# Patient Record
Sex: Male | Born: 1940 | Race: White | Hispanic: No | Marital: Single | Smoking: Former smoker
Health system: Southern US, Community
[De-identification: ages and names within clinical notes are randomized; demographics above are authoritative.]

## PROBLEM LIST (undated history)

## (undated) DIAGNOSIS — R569 Unspecified convulsions: Secondary | ICD-10-CM

## (undated) DIAGNOSIS — I714 Abdominal aortic aneurysm, without rupture, unspecified: Secondary | ICD-10-CM

## (undated) DIAGNOSIS — Z941 Heart transplant status: Secondary | ICD-10-CM

## (undated) HISTORY — PX: KNEE SURGERY: SHX244

## (undated) HISTORY — PX: EYE SURGERY: SHX253

## (undated) HISTORY — PX: SHOULDER SURGERY: SHX246

## (undated) HISTORY — PX: CARDIAC SURGERY: SHX584

## (undated) HISTORY — PX: APPENDECTOMY: SHX54

---

## 2014-12-30 ENCOUNTER — Inpatient Hospital Stay (HOSPITAL_COMMUNITY)
Admission: EM | Admit: 2014-12-30 | Discharge: 2015-01-03 | DRG: 064 | Disposition: A | Discharge status: Skilled Nursing Facility | Payer: Medicare Other | Source: Other Acute Inpatient Hospital | Attending: Internal Medicine | Admitting: Internal Medicine

## 2014-12-30 ENCOUNTER — Emergency Department (HOSPITAL_COMMUNITY): Payer: Medicare Other

## 2014-12-30 ENCOUNTER — Encounter (HOSPITAL_COMMUNITY): Payer: Self-pay | Admitting: Radiology

## 2014-12-30 DIAGNOSIS — R401 Stupor: Secondary | ICD-10-CM

## 2014-12-30 DIAGNOSIS — G9340 Encephalopathy, unspecified: Secondary | ICD-10-CM | POA: Diagnosis present

## 2014-12-30 DIAGNOSIS — I639 Cerebral infarction, unspecified: Secondary | ICD-10-CM

## 2014-12-30 DIAGNOSIS — N183 Chronic kidney disease, stage 3 unspecified: Secondary | ICD-10-CM | POA: Diagnosis present

## 2014-12-30 DIAGNOSIS — I63511 Cerebral infarction due to unspecified occlusion or stenosis of right middle cerebral artery: Principal | ICD-10-CM | POA: Diagnosis present

## 2014-12-30 DIAGNOSIS — Z79899 Other long term (current) drug therapy: Secondary | ICD-10-CM

## 2014-12-30 DIAGNOSIS — Z8249 Family history of ischemic heart disease and other diseases of the circulatory system: Secondary | ICD-10-CM | POA: Diagnosis not present

## 2014-12-30 DIAGNOSIS — Z91048 Other nonmedicinal substance allergy status: Secondary | ICD-10-CM | POA: Diagnosis not present

## 2014-12-30 DIAGNOSIS — Z8546 Personal history of malignant neoplasm of prostate: Secondary | ICD-10-CM | POA: Diagnosis not present

## 2014-12-30 DIAGNOSIS — G904 Autonomic dysreflexia: Secondary | ICD-10-CM | POA: Diagnosis present

## 2014-12-30 DIAGNOSIS — I714 Abdominal aortic aneurysm, without rupture: Secondary | ICD-10-CM | POA: Diagnosis present

## 2014-12-30 DIAGNOSIS — R569 Unspecified convulsions: Secondary | ICD-10-CM | POA: Diagnosis not present

## 2014-12-30 DIAGNOSIS — E785 Hyperlipidemia, unspecified: Secondary | ICD-10-CM | POA: Diagnosis present

## 2014-12-30 DIAGNOSIS — I951 Orthostatic hypotension: Secondary | ICD-10-CM | POA: Diagnosis present

## 2014-12-30 DIAGNOSIS — Z941 Heart transplant status: Secondary | ICD-10-CM

## 2014-12-30 DIAGNOSIS — J189 Pneumonia, unspecified organism: Secondary | ICD-10-CM | POA: Diagnosis not present

## 2014-12-30 DIAGNOSIS — F0390 Unspecified dementia without behavioral disturbance: Secondary | ICD-10-CM | POA: Diagnosis present

## 2014-12-30 DIAGNOSIS — F05 Delirium due to known physiological condition: Secondary | ICD-10-CM | POA: Diagnosis not present

## 2014-12-30 DIAGNOSIS — G40909 Epilepsy, unspecified, not intractable, without status epilepticus: Secondary | ICD-10-CM | POA: Diagnosis present

## 2014-12-30 DIAGNOSIS — Z87891 Personal history of nicotine dependence: Secondary | ICD-10-CM | POA: Diagnosis not present

## 2014-12-30 DIAGNOSIS — Z993 Dependence on wheelchair: Secondary | ICD-10-CM | POA: Diagnosis not present

## 2014-12-30 DIAGNOSIS — I6789 Other cerebrovascular disease: Secondary | ICD-10-CM | POA: Diagnosis not present

## 2014-12-30 DIAGNOSIS — I6389 Other cerebral infarction: Secondary | ICD-10-CM

## 2014-12-30 HISTORY — DX: Abdominal aortic aneurysm, without rupture: I71.4

## 2014-12-30 HISTORY — DX: Unspecified convulsions: R56.9

## 2014-12-30 HISTORY — DX: Heart transplant status: Z94.1

## 2014-12-30 HISTORY — DX: Abdominal aortic aneurysm, without rupture, unspecified: I71.40

## 2014-12-30 LAB — COMPREHENSIVE METABOLIC PANEL
ALT: 48 U/L (ref 17–63)
ANION GAP: 7 (ref 5–15)
AST: 38 U/L (ref 15–41)
Albumin: 3.3 g/dL — ABNORMAL LOW (ref 3.5–5.0)
Alkaline Phosphatase: 138 U/L — ABNORMAL HIGH (ref 38–126)
BUN: 35 mg/dL — ABNORMAL HIGH (ref 6–20)
CHLORIDE: 102 mmol/L (ref 101–111)
CO2: 22 mmol/L (ref 22–32)
CREATININE: 1.58 mg/dL — AB (ref 0.61–1.24)
Calcium: 9 mg/dL (ref 8.9–10.3)
GFR calc non Af Amer: 41 mL/min — ABNORMAL LOW (ref >60)
GFR, EST AFRICAN AMERICAN: 47 mL/min — AB (ref >60)
Glucose, Bld: 176 mg/dL — ABNORMAL HIGH (ref 65–99)
Potassium: 4.8 mmol/L (ref 3.5–5.1)
SODIUM: 131 mmol/L — AB (ref 135–145)
Total Bilirubin: 0.4 mg/dL (ref 0.3–1.2)
Total Protein: 7.2 g/dL (ref 6.5–8.1)

## 2014-12-30 LAB — RAPID URINE DRUG SCREEN, HOSP PERFORMED
AMPHETAMINES: NOT DETECTED
BENZODIAZEPINES: NOT DETECTED
Barbiturates: NOT DETECTED
Cocaine: NOT DETECTED
Opiates: NOT DETECTED
Tetrahydrocannabinol: NOT DETECTED

## 2014-12-30 LAB — DIFFERENTIAL
BASOS ABS: 0 10*3/uL (ref 0.0–0.1)
BASOS PCT: 0 %
Eosinophils Absolute: 0 10*3/uL (ref 0.0–0.7)
Eosinophils Relative: 0 %
Lymphocytes Relative: 6 %
Lymphs Abs: 0.6 10*3/uL — ABNORMAL LOW (ref 0.7–4.0)
MONO ABS: 0.6 10*3/uL (ref 0.1–1.0)
Monocytes Relative: 6 %
NEUTROS ABS: 9 10*3/uL — AB (ref 1.7–7.7)
NEUTROS PCT: 88 %

## 2014-12-30 LAB — URINE MICROSCOPIC-ADD ON

## 2014-12-30 LAB — CBC
HCT: 32.1 % — ABNORMAL LOW (ref 39.0–52.0)
HCT: 33.2 % — ABNORMAL LOW (ref 39.0–52.0)
Hemoglobin: 11 g/dL — ABNORMAL LOW (ref 13.0–17.0)
Hemoglobin: 11.2 g/dL — ABNORMAL LOW (ref 13.0–17.0)
MCH: 29.4 pg (ref 26.0–34.0)
MCH: 29 pg (ref 26.0–34.0)
MCHC: 34.3 g/dL (ref 30.0–36.0)
MCHC: 33.7 g/dL (ref 30.0–36.0)
MCV: 85.8 fL (ref 78.0–100.0)
MCV: 86 fL (ref 78.0–100.0)
PLATELETS: 141 10*3/uL — AB (ref 150–400)
PLATELETS: 154 10*3/uL (ref 150–400)
RBC: 3.74 MIL/uL — ABNORMAL LOW (ref 4.22–5.81)
RBC: 3.86 MIL/uL — ABNORMAL LOW (ref 4.22–5.81)
RDW: 14.2 % (ref 11.5–15.5)
RDW: 14.2 % (ref 11.5–15.5)
WBC: 10.2 10*3/uL (ref 4.0–10.5)
WBC: 9.5 10*3/uL (ref 4.0–10.5)

## 2014-12-30 LAB — URINALYSIS, ROUTINE W REFLEX MICROSCOPIC
Bilirubin Urine: NEGATIVE
GLUCOSE, UA: 100 mg/dL — AB
Ketones, ur: NEGATIVE mg/dL
Leukocytes, UA: NEGATIVE
Nitrite: NEGATIVE
PH: 6 (ref 5.0–8.0)
Protein, ur: 100 mg/dL — AB
SPECIFIC GRAVITY, URINE: 1.025 (ref 1.005–1.030)
Urobilinogen, UA: 0.2 mg/dL (ref 0.0–1.0)

## 2014-12-30 LAB — I-STAT CHEM 8, ED
BUN: 36 mg/dL — ABNORMAL HIGH (ref 6–20)
Calcium, Ion: 1.21 mmol/L (ref 1.13–1.30)
Chloride: 102 mmol/L (ref 101–111)
Creatinine, Ser: 1.4 mg/dL — ABNORMAL HIGH (ref 0.61–1.24)
Glucose, Bld: 176 mg/dL — ABNORMAL HIGH (ref 65–99)
HEMATOCRIT: 34 % — AB (ref 39.0–52.0)
HEMOGLOBIN: 11.6 g/dL — AB (ref 13.0–17.0)
POTASSIUM: 4.7 mmol/L (ref 3.5–5.1)
SODIUM: 132 mmol/L — AB (ref 135–145)
TCO2: 19 mmol/L (ref 0–100)

## 2014-12-30 LAB — PROTIME-INR
INR: 1.25 (ref 0.00–1.49)
PROTHROMBIN TIME: 15.9 s — AB (ref 11.6–15.2)

## 2014-12-30 LAB — I-STAT TROPONIN, ED: Troponin i, poc: 0 ng/mL (ref 0.00–0.08)

## 2014-12-30 LAB — CREATININE, SERUM
Creatinine, Ser: 1.47 mg/dL — ABNORMAL HIGH (ref 0.61–1.24)
GFR calc Af Amer: 52 mL/min — ABNORMAL LOW (ref >60)
GFR calc non Af Amer: 45 mL/min — ABNORMAL LOW (ref >60)

## 2014-12-30 LAB — I-STAT CG4 LACTIC ACID, ED
Lactic Acid, Venous: 0.72 mmol/L (ref 0.5–2.0)
Lactic Acid, Venous: 1.27 mmol/L (ref 0.5–2.0)

## 2014-12-30 LAB — APTT: APTT: 29 s (ref 24–37)

## 2014-12-30 MED ORDER — GUAIFENESIN-DM 100-10 MG/5ML PO SYRP: 5.0000 mL | ORAL_SOLUTION | ORAL | Status: DC | PRN

## 2014-12-30 MED ORDER — ONDANSETRON HCL 4 MG/2ML IJ SOLN: 4.0000 mg | Freq: Four times a day (QID) | INTRAMUSCULAR | Status: DC | PRN

## 2014-12-30 MED ORDER — LORAZEPAM 2 MG/ML IJ SOLN: 1.0000 mg | Freq: Four times a day (QID) | INTRAMUSCULAR | Status: DC | PRN

## 2014-12-30 MED ORDER — PIPERACILLIN-TAZOBACTAM 3.375 G IVPB: 3.3750 g | Freq: Three times a day (TID) | INTRAVENOUS | Status: DC

## 2014-12-30 MED ORDER — TACROLIMUS 1 MG PO CAPS
4.0000 mg | ORAL_CAPSULE | Freq: Every day | ORAL | Status: DC
  Administered 2014-12-31 – 2015-01-02 (×3): 4 mg via ORAL
  Filled 2014-12-30 (×5): qty 4

## 2014-12-30 MED ORDER — ALBUTEROL SULFATE (2.5 MG/3ML) 0.083% IN NEBU: 2.5000 mg | INHALATION_SOLUTION | RESPIRATORY_TRACT | Status: DC | PRN

## 2014-12-30 MED ORDER — ACETAMINOPHEN 650 MG RE SUPP: 650.0000 mg | Freq: Four times a day (QID) | RECTAL | Status: DC | PRN

## 2014-12-30 MED ORDER — SODIUM CHLORIDE 1 G PO TABS
1.0000 g | ORAL_TABLET | Freq: Four times a day (QID) | ORAL | Status: DC
  Administered 2014-12-31 – 2015-01-03 (×13): 1 g via ORAL
  Filled 2014-12-30 (×17): qty 1

## 2014-12-30 MED ORDER — SULFAMETHOXAZOLE-TRIMETHOPRIM 400-80 MG PO TABS
1.0000 | ORAL_TABLET | ORAL | Status: DC
  Administered 2014-12-31 – 2015-01-03 (×2): 1 via ORAL
  Filled 2014-12-30 (×2): qty 1

## 2014-12-30 MED ORDER — FINASTERIDE 5 MG PO TABS
5.0000 mg | ORAL_TABLET | Freq: Every day | ORAL | Status: DC
  Administered 2014-12-31 – 2015-01-03 (×4): 5 mg via ORAL
  Filled 2014-12-30 (×4): qty 1

## 2014-12-30 MED ORDER — PIPERACILLIN-TAZOBACTAM 3.375 G IVPB 30 MIN: 3.3750 g | Freq: Once | INTRAVENOUS | Status: DC

## 2014-12-30 MED ORDER — ONDANSETRON HCL 4 MG PO TABS: 4.0000 mg | ORAL_TABLET | Freq: Four times a day (QID) | ORAL | Status: DC | PRN

## 2014-12-30 MED ORDER — PROSIGHT PO TABS
1.0000 | ORAL_TABLET | Freq: Every day | ORAL | Status: DC
Start: 2014-12-31 — End: 2015-01-03
  Administered 2014-12-31 – 2015-01-03 (×4): 1 via ORAL
  Filled 2014-12-30 (×4): qty 1

## 2014-12-30 MED ORDER — VANCOMYCIN HCL IN DEXTROSE 750-5 MG/150ML-% IV SOLN
750.0000 mg | Freq: Two times a day (BID) | INTRAVENOUS | Status: DC
  Administered 2014-12-31 – 2015-01-01 (×3): 750 mg via INTRAVENOUS
  Filled 2014-12-30 (×5): qty 150

## 2014-12-30 MED ORDER — TACROLIMUS 1 MG PO CAPS
3.0000 mg | ORAL_CAPSULE | Freq: Every day | ORAL | Status: DC
  Administered 2014-12-31 – 2015-01-03 (×4): 3 mg via ORAL
  Filled 2014-12-30 (×4): qty 3

## 2014-12-30 MED ORDER — SODIUM CHLORIDE 0.9 % IV SOLN
INTRAVENOUS | Status: DC
Start: 2014-12-30 — End: 2015-01-02
  Administered 2014-12-30 – 2015-01-01 (×4): via INTRAVENOUS

## 2014-12-30 MED ORDER — VANCOMYCIN HCL 10 G IV SOLR
1750.0000 mg | Freq: Once | INTRAVENOUS | Status: AC
  Administered 2014-12-30: 1750 mg via INTRAVENOUS
  Filled 2014-12-30: qty 1750

## 2014-12-30 MED ORDER — ENOXAPARIN SODIUM 40 MG/0.4ML ~~LOC~~ SOLN
40.0000 mg | SUBCUTANEOUS | Status: DC
Start: 2014-12-31 — End: 2015-01-03
  Administered 2014-12-31 – 2015-01-03 (×4): 40 mg via SUBCUTANEOUS
  Filled 2014-12-30 (×4): qty 0.4

## 2014-12-30 MED ORDER — MIDODRINE HCL 5 MG PO TABS
10.0000 mg | ORAL_TABLET | Freq: Three times a day (TID) | ORAL | Status: DC
  Administered 2014-12-31 – 2015-01-03 (×10): 10 mg via ORAL
  Filled 2014-12-30 (×10): qty 2

## 2014-12-30 MED ORDER — ACETAMINOPHEN 325 MG PO TABS: 650.0000 mg | ORAL_TABLET | Freq: Four times a day (QID) | ORAL | Status: DC | PRN

## 2014-12-30 MED ORDER — PRAVASTATIN SODIUM 40 MG PO TABS
40.0000 mg | ORAL_TABLET | Freq: Every day | ORAL | Status: DC
  Administered 2014-12-31: 40 mg via ORAL
  Filled 2014-12-30: qty 1

## 2014-12-30 MED ORDER — FAMOTIDINE 20 MG PO TABS
20.0000 mg | ORAL_TABLET | Freq: Every day | ORAL | Status: DC
  Administered 2014-12-31 – 2015-01-03 (×4): 20 mg via ORAL
  Filled 2014-12-30 (×4): qty 1

## 2014-12-30 MED ORDER — SODIUM CHLORIDE 0.9 % IV SOLN
500.0000 mg | Freq: Three times a day (TID) | INTRAVENOUS | Status: DC
  Administered 2014-12-30 – 2014-12-31 (×3): 500 mg via INTRAVENOUS
  Filled 2014-12-30 (×5): qty 500

## 2014-12-30 MED ORDER — LEVETIRACETAM 500 MG/5ML IV SOLN
500.0000 mg | Freq: Two times a day (BID) | INTRAVENOUS | Status: DC
  Administered 2014-12-30 – 2014-12-31 (×2): 500 mg via INTRAVENOUS
  Filled 2014-12-30 (×3): qty 5

## 2014-12-30 MED ORDER — SODIUM CHLORIDE 0.9 % IJ SOLN
3.0000 mL | Freq: Two times a day (BID) | INTRAMUSCULAR | Status: DC
  Administered 2014-12-30 – 2015-01-03 (×8): 3 mL via INTRAVENOUS

## 2014-12-30 MED ORDER — IOHEXOL 350 MG/ML SOLN
50.0000 mL | Freq: Once | INTRAVENOUS | Status: AC | PRN
  Administered 2014-12-30: 50 mL via INTRAVENOUS

## 2014-12-30 MED ORDER — SERTRALINE HCL 50 MG PO TABS
50.0000 mg | ORAL_TABLET | Freq: Every day | ORAL | Status: DC
  Administered 2014-12-31 – 2015-01-03 (×4): 50 mg via ORAL
  Filled 2014-12-30 (×4): qty 1

## 2014-12-30 NOTE — ED Notes (Signed)
Pt's daughter states that the pt has orthostatic hypotension, and actually takes medication to prevent his BP from dropping when he changes position.

## 2014-12-30 NOTE — ED Notes (Signed)
Westly PamLacy with [facility] called to check on pt. Made her aware that pt would be admitted and will not be at home for care today.

## 2014-12-30 NOTE — ED Notes (Signed)
Attempted report 

## 2014-12-30 NOTE — Progress Notes (Addendum)
Dr.Adrienne Mocha.  Alexander Conley  Has been aware of Pt BP 166/104. At this point rec'd orders to not controlling BP unless greater than 180. Will cont. To monitor BP  Pt is confused unable to get initial admission assessment done.No family member at bedside.

## 2014-12-30 NOTE — Procedures (Signed)
ELECTROENCEPHALOGRAM REPORT   Patient: Alexander Conley       Age: 74 y.o.        Sex: male Referring Physician: Dr Thad Rangereynolds Report Date:  12/30/2014        Interpreting Physician: Omelia BlackwaterSUMNER, Lauria Depoy JUSTIN  History: Alexander OdeaGerald Hakanson is an 74 y.o. male admitted with alterede mental status  Medications:  Scheduled:  Conditions of Recording:  This is a 16 channel EEG carried out with the patient in the drowsy state.  Description:  The background activity consists of a low voltage, symmetrical, poorly organized mixed of theta and occasional delta activity. There is also brief runs of posterior beta activity. No posterior dominant alpha rhythm is noted. No focal slowing or epileptiform activity is noted  Hyperventilation and intermittent photic stimulation was not performed.   IMPRESSION: Abnormal EEG due to the presence of generalized irregular slowing indicating a mild to moderate cerebral disturbance (encephalopathy). No focal slowing or epileptiform activity noted.    Elspeth Choeter Shaunte Tuft, DO [facility] [phone]  If 7pm- 7am, please page neurology on call as listed in AMION. 12/30/2014, 4:46 PM

## 2014-12-30 NOTE — ED Provider Notes (Signed)
CSN: [id number]     Arrival date & time 12/30/14  1530 History   First MD Initiated Contact with Patient 12/30/14 1542     Chief Complaint  Patient presents with  . Code Stroke     (Consider location/radiation/quality/duration/timing/severity/associated sxs/prior Treatment) The history is provided by the spouse and the EMS personnel. The history is limited by the condition of the patient.  Alexander Conley is a 74 y.o. male hx of aortic aneurysm, heart transplant, multiple myeloma, CHF here with stroke symptoms. Patient woke up around 8:00 this morning. He was doing fine until 9:15 AM. Wife noticed that he seemed to fall asleep. She tries to wake him up but he did not wake up and didn't answer any questions. She called EMS and went [facility]. He had a stroke workup and transferred here to neurology eval. Labs there is significant for white count 12, lactate 8, creatinine 1.8. Patient cannot get MRI since he has aortic stent and metal around his heart. Has a history TIAs in past. Patient actually had 3 admissions over the last month for orthostasis and syncope.     Past Medical History  Diagnosis Date  . H/O heart transplant ([place])   . Abdominal aortic aneurysm (AAA) ([place])   . Seizures [facility])    Past Surgical History  Procedure Laterality Date  . Cardiac surgery      Heart Transplant  . Appendectomy    . Shoulder surgery    . Knee surgery    . Eye surgery      Cataract   Family History  Problem Relation Age of Onset  . Hypertension Mother   . Hyperlipidemia Mother    Social History  Substance Use Topics  . Smoking status: Former Research scientist (life sciences)  . Smokeless tobacco: None  . Alcohol Use: Yes     Comment: Rarely    Review of Systems  Unable to perform ROS: Acuity of condition  Neurological: Positive for speech difficulty and weakness.  All other systems reviewed and are negative.     Allergies  Tape  Home Medications   Prior to Admission medications   Medication Sig  Start Date End Date Taking? Authorizing Provider  amoxicillin (AMOXIL) 500 MG capsule Take 2,000 mg by mouth as needed (1 hour prior to dental appt).  10/13/14  Yes Historical Provider, MD  docusate sodium (COLACE) 100 MG capsule Take 100 mg by mouth at bedtime.   Yes Historical Provider, MD  famotidine (PEPCID) 20 MG tablet Take 20 mg by mouth daily at 12 noon.   Yes Historical Provider, MD  finasteride (PROSCAR) 5 MG tablet Take 5 mg by mouth daily at 12 noon. 12/15/14 12/15/15 Yes Historical Provider, MD  midodrine (PROAMATINE) 10 MG tablet Take 10 mg by mouth 3 (three) times daily. 12/15/14  Yes Historical Provider, MD  Multiple Vitamins-Minerals (PRESERVISION AREDS 2) CAPS Take 1 tablet by mouth daily at 12 noon.   Yes Historical Provider, MD  pravastatin (PRAVACHOL) 40 MG tablet Take 40 mg by mouth at bedtime.   Yes Historical Provider, MD  sertraline (ZOLOFT) 50 MG tablet Take 50 mg by mouth daily at 12 noon.   Yes Historical Provider, MD  sodium chloride 1 G tablet Take 1 g by mouth 4 (four) times daily.   Yes Historical Provider, MD  sulfamethoxazole-trimethoprim (BACTRIM,SEPTRA) 400-80 MG tablet Take 1 tablet by mouth every Monday, Wednesday, and Friday.   Yes Historical Provider, MD  tacrolimus (PROGRAF) 1 MG capsule Take 3-4 mg by  mouth 2 (two) times daily. 11/10/14 11/10/15 Yes Historical Provider, MD   BP 174/112 mmHg  Pulse 95  Temp(Src) 97.1 F (36.2 C) (Oral)  Resp 20  Ht 6' 1"  (1.854 m)  Wt 188 lb (85.276 kg)  BMI 24.81 kg/m2  SpO2 97% Physical Exam  Constitutional:  Altered,   HENT:  Head: Normocephalic.  Eyes:  R gaze preference   Neck: Normal range of motion.  Cardiovascular: Normal rate, regular rhythm and normal heart sounds.   Pulmonary/Chest: Effort normal and breath sounds normal. No respiratory distress. He has no wheezes. He has no rales.  Abdominal: Soft. Bowel sounds are normal. He exhibits no distension. There is no tenderness. There is no rebound.   Musculoskeletal: Normal range of motion.  Neurological:  Altered. Responds to pain on L side more than Right. Moving l side more than right   Skin: Skin is warm and dry.  Psychiatric:  Unable   Nursing note and vitals reviewed.   ED Course  Procedures (including critical care time) Labs Review Labs Reviewed  CBC - Abnormal; Notable for the following:    RBC 3.74 (*)    Hemoglobin 11.0 (*)    HCT 32.1 (*)    Platelets 141 (*)    All other components within normal limits  DIFFERENTIAL - Abnormal; Notable for the following:    Neutro Abs 9.0 (*)    Lymphs Abs 0.6 (*)    All other components within normal limits  COMPREHENSIVE METABOLIC PANEL - Abnormal; Notable for the following:    Sodium 131 (*)    Glucose, Bld 176 (*)    BUN 35 (*)    Creatinine, Ser 1.58 (*)    Albumin 3.3 (*)    Alkaline Phosphatase 138 (*)    GFR calc non Af Amer 41 (*)    GFR calc Af Amer 47 (*)    All other components within normal limits  I-STAT CHEM 8, ED - Abnormal; Notable for the following:    Sodium 132 (*)    BUN 36 (*)    Creatinine, Ser 1.40 (*)    Glucose, Bld 176 (*)    Hemoglobin 11.6 (*)    HCT 34.0 (*)    All other components within normal limits  CULTURE, BLOOD (ROUTINE X 2)  CULTURE, BLOOD (ROUTINE X 2)  ETHANOL  PROTIME-INR  APTT  URINE RAPID DRUG SCREEN, HOSP PERFORMED  URINALYSIS, ROUTINE W REFLEX MICROSCOPIC (NOT AT [facility])  I-STAT TROPOININ, ED  I-STAT CG4 LACTIC ACID, ED    Imaging Review Ct Angio Head W/cm &/or Wo Cm  12/30/2014  CLINICAL DATA:  74 year old male with left-sided weakness (per Dr. Janann Colonel). Initial encounter. EXAM: CT ANGIOGRAPHY HEAD AND NECK TECHNIQUE: Multidetector CT imaging of the head and neck was performed using the standard protocol during bolus administration of intravenous contrast. Multiplanar CT image reconstructions and MIPs were obtained to evaluate the vascular anatomy. Carotid stenosis measurements (when applicable) are obtained utilizing  NASCET criteria, using the distal internal carotid diameter as the denominator. CONTRAST:  55m OMNIPAQUE IOHEXOL 350 MG/ML SOLN COMPARISON:  None. FINDINGS: CT HEAD Brain: Possibility of right middle cerebral artery distribution is raised. No intracranial hemorrhage or new intracranial enhancing lesion. No hydrocephalus. Calvarium and skull base: Negative. Paranasal sinuses: Clear. Orbits: Negative.  Post lens replacement. CTA NECK Aortic arch: Ectatic 3 vessel aortic arch. Calcified plaque with slight narrowing origin left common carotid artery and left subclavian artery. Right carotid system: Plaque right carotid bifurcation. Ectatic right  internal carotid artery with irregular plaque contributing to 68% diameter stenosis proximal right internal carotid artery. Plaque distal cervical segment right internal carotid artery with less than 50% diameter stenosis. Left carotid system: Plaque left carotid bifurcation. 64% diameter stenosis proximal left internal carotid artery. Calcification distal left internal carotid artery cervical segment with less than 50% diameter stenosis. Vertebral arteries:Minimal narrowing origin of the right vertebral artery. Mild narrowing origin left vertebral artery. Right vertebral artery slightly dominant in size. Skeleton: Multilevel cervical spondylotic changes with various degrees of spinal stenosis and foraminal narrowing. Other neck: No worrisome primary neck mass or lung apical lesion. CTA HEAD Anterior circulation: Atherosclerotic type changes cavernous segment of the internal carotid artery bilaterally. Mild narrowing right internal carotid artery cavernous segment with associated ectasia. Slightly prominent infundibulum without aneurysm. Mild to slightly moderate narrowing portion of the left internal carotid artery proximal cavernous segment of poststenotic ectasia. Questionable decrease number of visualized right middle cerebral artery branch vessels when compared to left.  Middle cerebral artery branch vessel narrowing and irregularity bilaterally. Posterior circulation: Ectatic vertebral arteries without high-grade stenosis. Ectatic basilar artery with possible small fenestration without high-grade stenosis. Venous sinuses: Patent. Anatomic variants: Negative. Delayed phase: Not obtained as per Dr. Hazle Quant request. Exam reviewed with Dr. Ronnette Juniper at the time of imaging. IMPRESSION: CT HEAD Possibility of right middle cerebral artery distribution is raised. No intracranial hemorrhage or new intracranial enhancing lesion. CTA NECK Ectatic right internal carotid artery with irregular plaque contributing to 68% diameter stenosis proximal right internal carotid artery. Plaque distal cervical segment right internal carotid artery with less than 50% diameter stenosis. 64% diameter stenosis proximal left internal carotid artery. Calcification distal left internal carotid artery cervical segment with less than 50% diameter stenosis. Minimal narrowing origin of the right vertebral artery. Mild narrowing origin left vertebral artery. Right vertebral artery slightly dominant in size. CTA HEAD Atherosclerotic type changes cavernous segment of the internal carotid artery bilaterally. Mild narrowing right internal carotid artery cavernous segment with associated ectasia. Mild to slightly moderate narrowing portion of the left internal carotid artery proximal cavernous segment of poststenotic ectasia. Questionable decrease number of visualized right middle cerebral artery branch vessels when compared to left. Middle cerebral artery branch vessel narrowing and irregularity bilaterally. Ectatic vertebral arteries without high-grade stenosis. Ectatic basilar artery with possible small fenestration without high-grade stenosis. Electronically Signed   By: Genia Del M.D.   On: 12/30/2014 16:34   Ct Angio Neck W/cm &/or Wo/cm  12/30/2014  CLINICAL DATA:  74 year old male with left-sided weakness  (per Dr. Janann Colonel). Initial encounter. EXAM: CT ANGIOGRAPHY HEAD AND NECK TECHNIQUE: Multidetector CT imaging of the head and neck was performed using the standard protocol during bolus administration of intravenous contrast. Multiplanar CT image reconstructions and MIPs were obtained to evaluate the vascular anatomy. Carotid stenosis measurements (when applicable) are obtained utilizing NASCET criteria, using the distal internal carotid diameter as the denominator. CONTRAST:  59m OMNIPAQUE IOHEXOL 350 MG/ML SOLN COMPARISON:  None. FINDINGS: CT HEAD Brain: Possibility of right middle cerebral artery distribution is raised. No intracranial hemorrhage or new intracranial enhancing lesion. No hydrocephalus. Calvarium and skull base: Negative. Paranasal sinuses: Clear. Orbits: Negative.  Post lens replacement. CTA NECK Aortic arch: Ectatic 3 vessel aortic arch. Calcified plaque with slight narrowing origin left common carotid artery and left subclavian artery. Right carotid system: Plaque right carotid bifurcation. Ectatic right internal carotid artery with irregular plaque contributing to 68% diameter stenosis proximal right internal carotid artery. Plaque distal cervical segment  right internal carotid artery with less than 50% diameter stenosis. Left carotid system: Plaque left carotid bifurcation. 64% diameter stenosis proximal left internal carotid artery. Calcification distal left internal carotid artery cervical segment with less than 50% diameter stenosis. Vertebral arteries:Minimal narrowing origin of the right vertebral artery. Mild narrowing origin left vertebral artery. Right vertebral artery slightly dominant in size. Skeleton: Multilevel cervical spondylotic changes with various degrees of spinal stenosis and foraminal narrowing. Other neck: No worrisome primary neck mass or lung apical lesion. CTA HEAD Anterior circulation: Atherosclerotic type changes cavernous segment of the internal carotid artery  bilaterally. Mild narrowing right internal carotid artery cavernous segment with associated ectasia. Slightly prominent infundibulum without aneurysm. Mild to slightly moderate narrowing portion of the left internal carotid artery proximal cavernous segment of poststenotic ectasia. Questionable decrease number of visualized right middle cerebral artery branch vessels when compared to left. Middle cerebral artery branch vessel narrowing and irregularity bilaterally. Posterior circulation: Ectatic vertebral arteries without high-grade stenosis. Ectatic basilar artery with possible small fenestration without high-grade stenosis. Venous sinuses: Patent. Anatomic variants: Negative. Delayed phase: Not obtained as per Dr. Hazle Quant request. Exam reviewed with Dr. Ronnette Juniper at the time of imaging. IMPRESSION: CT HEAD Possibility of right middle cerebral artery distribution is raised. No intracranial hemorrhage or new intracranial enhancing lesion. CTA NECK Ectatic right internal carotid artery with irregular plaque contributing to 68% diameter stenosis proximal right internal carotid artery. Plaque distal cervical segment right internal carotid artery with less than 50% diameter stenosis. 64% diameter stenosis proximal left internal carotid artery. Calcification distal left internal carotid artery cervical segment with less than 50% diameter stenosis. Minimal narrowing origin of the right vertebral artery. Mild narrowing origin left vertebral artery. Right vertebral artery slightly dominant in size. CTA HEAD Atherosclerotic type changes cavernous segment of the internal carotid artery bilaterally. Mild narrowing right internal carotid artery cavernous segment with associated ectasia. Mild to slightly moderate narrowing portion of the left internal carotid artery proximal cavernous segment of poststenotic ectasia. Questionable decrease number of visualized right middle cerebral artery branch vessels when compared to left.  Middle cerebral artery branch vessel narrowing and irregularity bilaterally. Ectatic vertebral arteries without high-grade stenosis. Ectatic basilar artery with possible small fenestration without high-grade stenosis. Electronically Signed   By: Genia Del M.D.   On: 12/30/2014 16:34   Dg Chest Port 1 View  12/30/2014  CLINICAL DATA:  Altered mental status and right-sided weakness EXAM: PORTABLE CHEST 1 VIEW COMPARISON:  12/30/2014 from [facility] FINDINGS: Streaky retrocardiac opacity, favoring atelectasis given morphology. Borderline cardiomegaly. Negative aortic and hilar contours. Patient has undergone median sternotomy, reportedly for heart transplant. No edema, effusion, or air leak. Symmetric biapical pleural scarring. IMPRESSION: Mild retrocardiac opacification, likely atelectasis. Given history of altered mental status, underlying aspiration should be considered. Electronically Signed   By: Monte Fantasia M.D.   On: 12/30/2014 16:12   I have personally reviewed and evaluated these images and lab results as part of my medical decision-making.   EKG Interpretation None      MDM   Final diagnoses:  Stroke [employer] [facility])   Alexander Conley is a 75 y.o. male here with stroke. Outside of window for TPA right now. Neuro at bedside and wants CT angio and wants to evaluate to see if he is a candidate for intra arterial TPA.   4:54 PM Lactate nl now. CXR showed possible atelectasis vs aspiration. Given multiple recent hospitalizations, will admit and give HCAP coverage. Neuro doesn't want intra arterial TPA.  Wandra Arthurs, MD 12/30/14 [phone]

## 2014-12-30 NOTE — ED Notes (Addendum)
This RN spoke with Mercy, RN on 2C, who states that she would like rapid response to assess the pt before he comes upstairs. April, RN with rapid response to see pt.

## 2014-12-30 NOTE — ED Notes (Signed)
Admitting MD at bedside.

## 2014-12-30 NOTE — ED Notes (Signed)
[facility]- pt coming from [facility] with stroke symptoms. Pt last seen normal at 0915 by family member. Pt has no hx of stroke. Right side gaze, right side weakness, pt has been minimally responsive at previous hospital as well as with EMS. Pt responsive to painful stimuli.

## 2014-12-30 NOTE — Code Documentation (Signed)
74[facility] at [phone].  Patient [facility].  Patient was LKW at 0915 at home when his wife went to take a shower and returned to find him unresponsive.  [facility] where he subsequently had a seizure and received Ativan.  Patient with reported right gaze, right sided weakness and aphasia.  Patient with h/o HTN and heart transplant in 2011.  Stroke team at the bedside on arrival.  Patient to CT for CTA head and neck on arrival.  NIHSS 35, see documentation for details and code stroke times.  Patient is outside the window for tPA and is not an endovascular candidate.  Bedside handoff with ED RN Nehemiah SettleBrooke.

## 2014-12-30 NOTE — ED Notes (Signed)
Pt has become more alert, mumbling sentences that do not make sense. Pt still does not follow commands, but will look at the speaker.

## 2014-12-30 NOTE — Progress Notes (Signed)
STAT EEG completed; results pending. 

## 2014-12-30 NOTE — ED Notes (Signed)
April, RN pt is okay to [facility], but would like to see his BP addressed before going upstairs.

## 2014-12-30 NOTE — Progress Notes (Addendum)
ANTIBIOTIC CONSULT NOTE - INITIAL  Pharmacy Consult for Zosyn and Vancomycin Indication: HCAP  Allergies  Allergen Reactions  . Tape Hives    adhesive    Patient Measurements: Height: 6\' 1"  (185.4 cm) Weight: 188 lb (85.276 kg) IBW/kg (Calculated) : 79.9  Vital Signs: Temp: 97.1 F (36.2 C) (10/13 1550) Temp Source: Oral (10/13 1550) BP: 174/112 mmHg (10/13 1645) Pulse Rate: 95 (10/13 1645) Intake/Output from previous day:   Intake/Output from this shift:    Labs:  Recent Labs  12/30/14 1600 12/30/14 1611  WBC 10.2  --   HGB 11.0* 11.6*  PLT 141*  --   CREATININE 1.58* 1.40*   Estimated Creatinine Clearance: 50.7 mL/min (by C-G formula based on Cr of 1.4). No results for input(s): VANCOTROUGH, VANCOPEAK, VANCORANDOM, GENTTROUGH, GENTPEAK, GENTRANDOM, TOBRATROUGH, TOBRAPEAK, TOBRARND, AMIKACINPEAK, AMIKACINTROU, AMIKACIN in the last 72 hours.   Microbiology: No results found for this or any previous visit (from the past 720 hour(s)).  Medical History: Past Medical History  Diagnosis Date  . H/O heart transplant ([facility])   . Abdominal aortic aneurysm (AAA) ([facility])   . Seizures [facility](H[facility])     Assessment: 74 yo [facility]. CXR showed Mild opacification that's likely atelectasis, but given history of AMS aspiration should be considered. Pharmacy consulted to dose abx for HCAP. Afebrile, WBC wnl. SCr elevated at 1.4. CrCl ~3850ml/min.  Goal of Therapy:  Vancomycin trough level 15-20 mcg/ml  Resolution of infection  Plan:  Give Zosyn 3.375g IV (30 min infusion) x 1, then start Zosyn 3.375 gm IV q8h (4 hour infusion) Give vancomycin 1,750mg  IV x 1, then start vancomycin 750mg  IV Q12 Monitor clinical picture, renal function, VT prn F/U C&S, abx deescalation / LOT  Enzo BiBATCHELDER,Latanza Pfefferkorn J 12/30/2014,4:48 PM  ADDENDUM:  Pharmacy consulted to start imipenem instead of Zosyn.CrCl ~5750ml/min.   Plan: Start imipenem 500mg  IV  Q8 Monitor clinical picture, renal function F/U C&S, abx deescalation / LOT

## 2014-12-30 NOTE — Consult Note (Signed)
Referring Physician: Silverio LayYao    Chief Complaint: AMS/seizure/code stroke  HPI:                                                                                                                                         Alexander Conley is an 74 y.o. male who awoke this AM at 0830 and per wife was his baseline. Baseline is :wheel chair bound, 2 person assist to transfer, needs assistance bathing, can feed himself, spends most days in bed or in chair. At 0915 he was noted by his wife to have AMS not talking or following commands.  He was [facility] where he was noted to have a TC seizure and given 2 mg Ativan. On arrival patient is obtunded, follows no commands, able to breath on his own and withdraws from pain on the left not the right. CTA of head was obtained which showed a possible distal right MCA occlusion which would not explain his symptoms.  EEG stat was called.   He cannot have MRI due to metal in his body  Date last known well: Date: 12/30/2014 Time last known well: Time: 09:15 tPA Given: No: out of window     Past Medical History  Diagnosis Date  . H/O heart transplant (HCC)   . Abdominal aortic aneurysm (AAA) (HCC)   . Seizures [facility](HCC)     Past Surgical History  Procedure Laterality Date  . Cardiac surgery      Heart Transplant  . Appendectomy    . Shoulder surgery    . Knee surgery    . Eye surgery      Cataract    Family History  Problem Relation Age of Onset  . Hypertension Mother   . Hyperlipidemia Mother    Social History:  reports that he has quit smoking. He does not have any smokeless tobacco history on file. He reports that he drinks alcohol. His drug history is not on file.  Allergies: No Known Allergies  Medications:                                                                                                                           No current facility-administered medications for this encounter.   No current outpatient prescriptions on  file.     ROS:  History obtained from unobtainable from patient due to mental status   Neurologic Examination:                                                                                                      Blood pressure 187/110, pulse 92, resp. rate 24, height  (1.854 m), weight 85.276 kg (188 lb), SpO2 98 %.  HEENT-  Normocephalic, no lesions, without obvious abnormality.  Normal external eye and conjunctiva.  Normal TM's bilaterally.  Normal auditory canals and external ears. Normal external nose, mucus membranes and septum.  Normal pharynx. Cardiovascular- S1, S2 normal, pulses palpable throughout   Lungs- chest clear, no wheezing, rales, normal symmetric air entry Abdomen- normal findings: bowel sounds normal Extremities- no edema Lymph-no adenopathy palpable Musculoskeletal-no joint tenderness, deformity or swelling Skin-warm and dry, no hyperpigmentation, vitiligo, or suspicious lesions  Neurological Examination Mental Status: Patient does not respond to verbal stimuli.  Does not respond to deep sternal rub.  Does not follow commands.  No verbalizations noted.  Cranial Nerves: II: patient does not respond confrontation bilaterally, pupils right 2 mm, left 1 mm,and minimally reactive bilaterally III,IV,VI: doll's response present bilaterally.  V,VII: corneal reflex present bilaterally  VIII: patient does not respond to verbal stimuli IX,X: gag reflex present, XI: trapezius strength unable to test bilaterally XII: tongue strength unable to test Motor: Extremities flaccid on the right.  withdraws from pain minimally on the right Sensory: As above. Deep Tendon Reflexes:  2 plus throughout. Plantars: downgoing bilaterally Cerebellar: Unable to perform       Lab Results: Basic Metabolic Panel: No results for input(s): NA, K,  CL, CO2, GLUCOSE, BUN, CREATININE, CALCIUM, MG, PHOS in the last 168 hours.  Liver Function Tests: No results for input(s): AST, ALT, ALKPHOS, BILITOT, PROT, ALBUMIN in the last 168 hours. No results for input(s): LIPASE, AMYLASE in the last 168 hours. No results for input(s): AMMONIA in the last 168 hours.  CBC: No results for input(s): WBC, NEUTROABS, HGB, HCT, MCV, PLT in the last 168 hours.  Cardiac Enzymes: No results for input(s): CKTOTAL, CKMB, CKMBINDEX, TROPONINI in the last 168 hours.  Lipid Panel: No results for input(s): CHOL, TRIG, HDL, CHOLHDL, VLDL, LDLCALC in the last 168 hours.  CBG: No results for input(s): GLUCAP in the last 168 hours.  Microbiology: No results found for this or any previous visit.  Coagulation Studies: No results for input(s): LABPROT, INR in the last 72 hours.  Imaging: No results found.     Assessment and plan discussed with with attending physician and they are in agreement.    Felicie Morn PA-C [facility] [phone]  12/30/2014, 3:58 PM   Assessment: 74 y.o. male with history of baseline dementia presenting with AMS of unknown etiology.  At 0915 he was noted by his wife to be minimally unresponsive, not talking with question of right sided weakness. While in [place] ED he was noted to have a tonic clonic seizure broke with  of ativan. CTA at [facility] shows possible right distal branch MCA occlusion which would not fully explain  his symptoms. No large vessel occlusion amendable to IR intervention. Stat EEG shows no acute seizure activity. Unclear etiology of his presentation.  Differential includes CVA vs seizure with post ictal state, metabolic dysfunction, embolic shower vs possible PRES/hypertensive encephalopathy. Currently afebrile with no leukocytosis making infectious process less likely. Saturating well making PE less likely but this would also be in the differential.   -EEG read pending -repeat head CT in the  morning (unable to get MRI as wife reports he has metal in his abdomen) -ammonia, TSH, B12, UA, UDS, blood cultures, CMP -will continue to follow

## 2014-12-30 NOTE — H&P (Addendum)
PATIENT DETAILS Name: Alexander Conley Age: 74 y.o. Sex: male Date of Birth: 1940/07/01 Admit Date: 12/30/2014 ZOX:[id number],[id number] A, MD Referring Physician:Dr Silverio Lay   CHIEF COMPLAINT:  Altered mental status  HPI: Alexander Conley is a 74 y.o. male with a Past Medical History of orthotopic heart transplant in 2011, stage III chronic kidney disease, history of prostate CVA, recent history of orthostatic hypotension-presenting to the ED for evaluation of altered mental status. Per family, he has had 3 hospitalizations recently at [facility] and at [facility] for evaluation of severe orthostatic hypotension. He was in his usual state of health this morning, when his wife noted that he was very difficult to arouse. EMS was called, patient was taken to [facility], where a CT of the head was negative. But per documentation from [facility] had a tonic-clonic seizure. There was some question of right-sided deficits, because of concern for acute CVA-patient was then transferred to [facility]. CTA head and neck was done which showed a possible right distal MCA occlusion-per neurology this would not explain his symptoms. EEG was also done which was negative. Patient during my evaluation has slowly started to respond, I saw patient moving all 4 extremities. He is still somewhat confused but -l has started to become more responsive than his initial presentation. Per family, he was told at [facility] he had his heart transplant that he cannot have a MRI. Per spouse-patient is bed to wheelchair bound-and has been non ambulatory for the past few months. He requires assistance for essentially all of his activities of daily living. Family denies any recent headache, fever, cough, nausea, vomiting or diarrhea. No recent history of rash  Please note-patient unable to participate in the history taking process-all of this history is obtained from spouse and daughter at  bedside  ALLERGIES:   Allergies  Allergen Reactions  . Tape Hives    adhesive    PAST MEDICAL HISTORY: Past Medical History  Diagnosis Date  . H/O heart transplant (HCC)   . Abdominal aortic aneurysm (AAA) (HCC)   . Seizures (HCC)     PAST SURGICAL HISTORY: Past Surgical History  Procedure Laterality Date  . Cardiac surgery      Heart Transplant  . Appendectomy    . Shoulder surgery    . Knee surgery    . Eye surgery      Cataract    MEDICATIONS AT HOME: Prior to Admission medications   Medication Sig Start Date End Date Taking? Authorizing Provider  amoxicillin (AMOXIL) 500 MG capsule Take 2,000 mg by mouth as needed (1 hour prior to dental appt).  10/13/14  Yes Historical Provider, MD  docusate sodium (COLACE) 100 MG capsule Take 100 mg by mouth at bedtime.   Yes Historical Provider, MD  famotidine (PEPCID) 20 MG tablet Take 20 mg by mouth daily at 12 noon.   Yes Historical Provider, MD  finasteride (PROSCAR) 5 MG tablet Take 5 mg by mouth daily at 12 noon. 12/15/14 12/15/15 Yes Historical Provider, MD  midodrine (PROAMATINE) 10 MG tablet Take 10 mg by mouth 3 (three) times daily. 12/15/14  Yes Historical Provider, MD  Multiple Vitamins-Minerals (PRESERVISION AREDS 2) CAPS Take 1 tablet by mouth daily at 12 noon.   Yes Historical Provider, MD  pravastatin (PRAVACHOL) 40 MG tablet Take 40 mg by mouth at bedtime.   Yes Historical Provider, MD  sertraline (ZOLOFT) 50 MG tablet Take 50 mg by mouth daily at 12 noon.  Yes Historical Provider, MD  sodium chloride 1 G tablet Take 1 g by mouth 4 (four) times daily.   Yes Historical Provider, MD  sulfamethoxazole-trimethoprim (BACTRIM,SEPTRA) 400-80 MG tablet Take 1 tablet by mouth every Monday, Wednesday, and Friday.   Yes Historical Provider, MD  tacrolimus (PROGRAF) 1 MG capsule Take 3-4 mg by mouth 2 (two) times daily. 11/10/14 11/10/15 Yes Historical Provider, MD    FAMILY HISTORY: Family History  Problem Relation Age of Onset   . Hypertension Mother   . Hyperlipidemia Mother     SOCIAL HISTORY:  reports that he has quit smoking. He does not have any smokeless tobacco history on file. He reports that he drinks alcohol. His drug history is not on file. Lives at: Home Mobility: Bedbound/Wheelchair  REVIEW OF SYSTEMS: Obtained from family. Constitutional:   No  Fevers, chills,  HEENT:    No headaches, Dysphagia,Sore throat,   Cardio-vascular: No chest pain,Orthopnea, PND,lower extremity edema, anasarca, palpitations  GI:  No heartburn, indigestion, abdominal pain, nausea, vomiting, diarrhea, melena or hematochezia  Resp: No shortness of breath, cough, hemoptysis,plueritic chest pain.   Skin:  No rash or lesions.  GU:  No dysuria, change in color of urine, no urgency or frequency.  No flank pain.  Musculoskeletal: No joint pain or swelling.  No decreased range of motion.  No back pain.  Endocrine: No heat intolerance, no cold intolerance, no polyuria, no polydipsia   PHYSICAL EXAM: Blood pressure 167/107, pulse 97, temperature 97.1 F (36.2 C), temperature source Oral, resp. rate 17, height 6\' 1"  (1.854 m), weight 85.276 kg (188 lb), SpO2 97 %.  General appearance : Still lethargic-mumbles a few words to questions. Opens eyes, tracks movement at times.  HEENT: Atraumatic and Normocephalic, pupils equally reactive to light and accomodation Neck: No JVD Chest:Good air entry bilaterally, no added sounds  CVS: S1 S2 regular, no murmurs.  Abdomen: Bowel sounds present, Non tender and not distended with no gaurding, rigidity or rebound. Extremities: B/L Lower Ext shows no edema, both legs are warm to touch. TED hose in place bilaterally Neurology:  Difficult exam given mental status-but seems to be moving all 4 extremities spontaneously. Skin:No Rash Wounds:N/A  LABS ON ADMISSION:   Recent Labs  12/30/14 1600 12/30/14 1611  NA 131* 132*  K 4.8 4.7  CL 102 102  CO2 22  --   GLUCOSE 176*  176*  BUN 35* 36*  CREATININE 1.58* 1.40*  CALCIUM 9.0  --     Recent Labs  12/30/14 1600  AST 38  ALT 48  ALKPHOS 138*  BILITOT 0.4  PROT 7.2  ALBUMIN 3.3*   No results for input(s): LIPASE, AMYLASE in the last 72 hours.  Recent Labs  12/30/14 1600 12/30/14 1611  WBC 10.2  --   NEUTROABS 9.0*  --   HGB 11.0* 11.6*  HCT 32.1* 34.0*  MCV 85.8  --   PLT 141*  --    No results for input(s): CKTOTAL, CKMB, CKMBINDEX, TROPONINI in the last 72 hours. No results for input(s): DDIMER in the last 72 hours. Invalid input(s): POCBNP   RADIOLOGIC STUDIES ON ADMISSION: Ct Angio Head W/cm &/or Wo Cm  12/30/2014  CLINICAL DATA:  74 year old male with left-sided weakness (per Dr. Hosie PoissonSumner). Initial encounter. EXAM: CT ANGIOGRAPHY HEAD AND NECK TECHNIQUE: Multidetector CT imaging of the head and neck was performed using the standard protocol during bolus administration of intravenous contrast. Multiplanar CT image reconstructions and MIPs were obtained to evaluate the  vascular anatomy. Carotid stenosis measurements (when applicable) are obtained utilizing NASCET criteria, using the distal internal carotid diameter as the denominator. CONTRAST:  50mL OMNIPAQUE IOHEXOL 350 MG/ML SOLN COMPARISON:  None. FINDINGS: CT HEAD Brain: Possibility of right middle cerebral artery distribution is raised. No intracranial hemorrhage or new intracranial enhancing lesion. No hydrocephalus. Calvarium and skull base: Negative. Paranasal sinuses: Clear. Orbits: Negative.  Post lens replacement. CTA NECK Aortic arch: Ectatic 3 vessel aortic arch. Calcified plaque with slight narrowing origin left common carotid artery and left subclavian artery. Right carotid system: Plaque right carotid bifurcation. Ectatic right internal carotid artery with irregular plaque contributing to 68% diameter stenosis proximal right internal carotid artery. Plaque distal cervical segment right internal carotid artery with less than 50%  diameter stenosis. Left carotid system: Plaque left carotid bifurcation. 64% diameter stenosis proximal left internal carotid artery. Calcification distal left internal carotid artery cervical segment with less than 50% diameter stenosis. Vertebral arteries:Minimal narrowing origin of the right vertebral artery. Mild narrowing origin left vertebral artery. Right vertebral artery slightly dominant in size. Skeleton: Multilevel cervical spondylotic changes with various degrees of spinal stenosis and foraminal narrowing. Other neck: No worrisome primary neck mass or lung apical lesion. CTA HEAD Anterior circulation: Atherosclerotic type changes cavernous segment of the internal carotid artery bilaterally. Mild narrowing right internal carotid artery cavernous segment with associated ectasia. Slightly prominent infundibulum without aneurysm. Mild to slightly moderate narrowing portion of the left internal carotid artery proximal cavernous segment of poststenotic ectasia. Questionable decrease number of visualized right middle cerebral artery branch vessels when compared to left. Middle cerebral artery branch vessel narrowing and irregularity bilaterally. Posterior circulation: Ectatic vertebral arteries without high-grade stenosis. Ectatic basilar artery with possible small fenestration without high-grade stenosis. Venous sinuses: Patent. Anatomic variants: Negative. Delayed phase: Not obtained as per Dr. Minus Breeding request. Exam reviewed with Dr. Wyatt Portela at the time of imaging. IMPRESSION: CT HEAD Possibility of right middle cerebral artery distribution is raised. No intracranial hemorrhage or new intracranial enhancing lesion. CTA NECK Ectatic right internal carotid artery with irregular plaque contributing to 68% diameter stenosis proximal right internal carotid artery. Plaque distal cervical segment right internal carotid artery with less than 50% diameter stenosis. 64% diameter stenosis proximal left internal carotid  artery. Calcification distal left internal carotid artery cervical segment with less than 50% diameter stenosis. Minimal narrowing origin of the right vertebral artery. Mild narrowing origin left vertebral artery. Right vertebral artery slightly dominant in size. CTA HEAD Atherosclerotic type changes cavernous segment of the internal carotid artery bilaterally. Mild narrowing right internal carotid artery cavernous segment with associated ectasia. Mild to slightly moderate narrowing portion of the left internal carotid artery proximal cavernous segment of poststenotic ectasia. Questionable decrease number of visualized right middle cerebral artery branch vessels when compared to left. Middle cerebral artery branch vessel narrowing and irregularity bilaterally. Ectatic vertebral arteries without high-grade stenosis. Ectatic basilar artery with possible small fenestration without high-grade stenosis. Electronically Signed   By: Lacy Duverney M.D.   On: 12/30/2014 16:34   Ct Angio Neck W/cm &/or Wo/cm  12/30/2014  CLINICAL DATA:  74 year old male with left-sided weakness (per Dr. Hosie Poisson). Initial encounter. EXAM: CT ANGIOGRAPHY HEAD AND NECK TECHNIQUE: Multidetector CT imaging of the head and neck was performed using the standard protocol during bolus administration of intravenous contrast. Multiplanar CT image reconstructions and MIPs were obtained to evaluate the vascular anatomy. Carotid stenosis measurements (when applicable) are obtained utilizing NASCET criteria, using the distal internal carotid diameter as the  denominator. CONTRAST:  50mL OMNIPAQUE IOHEXOL 350 MG/ML SOLN COMPARISON:  None. FINDINGS: CT HEAD Brain: Possibility of right middle cerebral artery distribution is raised. No intracranial hemorrhage or new intracranial enhancing lesion. No hydrocephalus. Calvarium and skull base: Negative. Paranasal sinuses: Clear. Orbits: Negative.  Post lens replacement. CTA NECK Aortic arch: Ectatic 3 vessel  aortic arch. Calcified plaque with slight narrowing origin left common carotid artery and left subclavian artery. Right carotid system: Plaque right carotid bifurcation. Ectatic right internal carotid artery with irregular plaque contributing to 68% diameter stenosis proximal right internal carotid artery. Plaque distal cervical segment right internal carotid artery with less than 50% diameter stenosis. Left carotid system: Plaque left carotid bifurcation. 64% diameter stenosis proximal left internal carotid artery. Calcification distal left internal carotid artery cervical segment with less than 50% diameter stenosis. Vertebral arteries:Minimal narrowing origin of the right vertebral artery. Mild narrowing origin left vertebral artery. Right vertebral artery slightly dominant in size. Skeleton: Multilevel cervical spondylotic changes with various degrees of spinal stenosis and foraminal narrowing. Other neck: No worrisome primary neck mass or lung apical lesion. CTA HEAD Anterior circulation: Atherosclerotic type changes cavernous segment of the internal carotid artery bilaterally. Mild narrowing right internal carotid artery cavernous segment with associated ectasia. Slightly prominent infundibulum without aneurysm. Mild to slightly moderate narrowing portion of the left internal carotid artery proximal cavernous segment of poststenotic ectasia. Questionable decrease number of visualized right middle cerebral artery branch vessels when compared to left. Middle cerebral artery branch vessel narrowing and irregularity bilaterally. Posterior circulation: Ectatic vertebral arteries without high-grade stenosis. Ectatic basilar artery with possible small fenestration without high-grade stenosis. Venous sinuses: Patent. Anatomic variants: Negative. Delayed phase: Not obtained as per Dr. Minus Breeding request. Exam reviewed with Dr. Wyatt Portela at the time of imaging. IMPRESSION: CT HEAD Possibility of right middle cerebral artery  distribution is raised. No intracranial hemorrhage or new intracranial enhancing lesion. CTA NECK Ectatic right internal carotid artery with irregular plaque contributing to 68% diameter stenosis proximal right internal carotid artery. Plaque distal cervical segment right internal carotid artery with less than 50% diameter stenosis. 64% diameter stenosis proximal left internal carotid artery. Calcification distal left internal carotid artery cervical segment with less than 50% diameter stenosis. Minimal narrowing origin of the right vertebral artery. Mild narrowing origin left vertebral artery. Right vertebral artery slightly dominant in size. CTA HEAD Atherosclerotic type changes cavernous segment of the internal carotid artery bilaterally. Mild narrowing right internal carotid artery cavernous segment with associated ectasia. Mild to slightly moderate narrowing portion of the left internal carotid artery proximal cavernous segment of poststenotic ectasia. Questionable decrease number of visualized right middle cerebral artery branch vessels when compared to left. Middle cerebral artery branch vessel narrowing and irregularity bilaterally. Ectatic vertebral arteries without high-grade stenosis. Ectatic basilar artery with possible small fenestration without high-grade stenosis. Electronically Signed   By: Lacy Duverney M.D.   On: 12/30/2014 16:34   Dg Chest Port 1 View  12/30/2014  CLINICAL DATA:  Altered mental status and right-sided weakness EXAM: PORTABLE CHEST 1 VIEW COMPARISON:  12/30/2014 from [facility]. FINDINGS: Streaky retrocardiac opacity, favoring atelectasis given morphology. Borderline cardiomegaly. Negative aortic and hilar contours. Patient has undergone median sternotomy, reportedly for heart transplant. No edema, effusion, or air leak. Symmetric biapical pleural scarring. IMPRESSION: Mild retrocardiac opacification, likely atelectasis. Given history of altered mental status, underlying  aspiration should be considered. Electronically Signed   By: Marnee Spring M.D.   On: 12/30/2014 16:12    I have personally  reviewed images of chest xray or the CT head   EKG: Personally reviewed. Normal sinus rhythm  ASSESSMENT AND PLAN: Present on Admission:  . Encephalopathy acute: Etiology unknown. However suspect seizures are the best explanation for his symptoms-suspect current mental status is a combination of post ictal state and IV Ativan that he received in [facility]. Although he could have had a small CVA-exam is nonfocal (although difficult), no fever and the abruptness of his symptoms argue against a infection-meningitis/encephalitis. He also had a significantly elevated lactate of around 8 while at [facility], which has subsequently normalized. Suspect that elevated lactate was likely due to seizures rather than infection. Spoke with neurology-Dr. Marni Griffon will empirically start him on Keppra, repeat EEG and CT head in the morning and follow clinical course. Monitor in step down. Plan was discussed with the family as well.  Marland Kitchen HCAP (healthcare-associated pneumonia): Some question of atelectasis/pneumonia and x-ray-could have aspirated given mental status. Will empirically treat with vancomycin and Primaxin. If he rapidly improves and does not have fever or leukocytosis-suspect we could rapidly narrow antibiotic spectrum.   . CKD (chronic kidney disease), stage III: Creatinine close to usual baseline. Follow   . Orthostatic hypotension: Patient has had long-standing issues with orthostatic hypotension in the past few months. Cautiously continue with Midodrine and sodium tablets. He previously was on Florinef-but this was discontinued because of severe hypokalemia.  .Orthotopic heart transplant in 2011: Continue Prograf and prophylactic Bactrim. Will check a Prograf level. Follows at [facility].  Further plan will depend as patient's clinical course evolves and further  radiologic and laboratory data become available. Patient will be monitored closely.  Please note above plan was formulated after personal review and summarization of most recent inpatient/outpatient records.  Plan of care was also discussed with the consultant-Dr Hosie Poisson with recommendations as stated above.  Above noted plan was discussed with patient/family face to face at bedside, they were in agreement.   CONSULTS: Neuro  DVT Prophylaxis: Prophylactic Lovenox   Code Status: Full Code  Disposition Plan:  Discharge back home possibly in 2-3 days,but may warrant SNF depending on clinical course  Total time spent  55 minutes.Greater than 50% of this time was spent in counseling, explanation of diagnosis, planning of further management, and coordination of care.  [facility] Pager [phone]  If 7PM-7AM, please contact night-coverage [email or web address] Password [email or web address] 12/30/2014, 5:54 PM

## 2014-12-31 ENCOUNTER — Inpatient Hospital Stay (HOSPITAL_COMMUNITY): Payer: Medicare Other

## 2014-12-31 DIAGNOSIS — G9340 Encephalopathy, unspecified: Secondary | ICD-10-CM

## 2014-12-31 LAB — CBC
HEMATOCRIT: 33.8 % — AB (ref 39.0–52.0)
Hemoglobin: 11.4 g/dL — ABNORMAL LOW (ref 13.0–17.0)
MCH: 29.1 pg (ref 26.0–34.0)
MCHC: 33.7 g/dL (ref 30.0–36.0)
MCV: 86.2 fL (ref 78.0–100.0)
PLATELETS: 167 10*3/uL (ref 150–400)
RBC: 3.92 MIL/uL — ABNORMAL LOW (ref 4.22–5.81)
RDW: 14.3 % (ref 11.5–15.5)
WBC: 10.2 10*3/uL (ref 4.0–10.5)

## 2014-12-31 LAB — MRSA PCR SCREENING: MRSA BY PCR: NEGATIVE

## 2014-12-31 LAB — BASIC METABOLIC PANEL
Anion gap: 12 (ref 5–15)
BUN: 29 mg/dL — AB (ref 6–20)
CALCIUM: 9.6 mg/dL (ref 8.9–10.3)
CO2: 21 mmol/L — ABNORMAL LOW (ref 22–32)
Chloride: 100 mmol/L — ABNORMAL LOW (ref 101–111)
Creatinine, Ser: 1.4 mg/dL — ABNORMAL HIGH (ref 0.61–1.24)
GFR calc Af Amer: 55 mL/min — ABNORMAL LOW (ref >60)
GFR, EST NON AFRICAN AMERICAN: 47 mL/min — AB (ref >60)
Glucose, Bld: 147 mg/dL — ABNORMAL HIGH (ref 65–99)
POTASSIUM: 4.6 mmol/L (ref 3.5–5.1)
SODIUM: 133 mmol/L — AB (ref 135–145)

## 2014-12-31 LAB — LIPID PANEL
Cholesterol: 185 mg/dL (ref 0–200)
HDL: 31 mg/dL — AB (ref >40)
LDL CALC: 120 mg/dL — AB (ref 0–99)
Total CHOL/HDL Ratio: 6 RATIO
Triglycerides: 168 mg/dL — ABNORMAL HIGH (ref <150)
VLDL: 34 mg/dL (ref 0–40)

## 2014-12-31 IMAGING — CT CT HEAD W/O CM
3 series · 17 of 30 positions shown, 19 images · non-contrast
Comparison: CTA head and neck [[facility]] hr today, and [facility] CT [RU] hr today.

CLINICAL DATA: 74-year-old male transferred from [facility]
with stroke-like symptoms. Initial encounter. possible right MCA
infarct.

EXAM:
CT HEAD WITHOUT CONTRAST
TECHNIQUE: Contiguous axial images were obtained from the base of the skull
through the vertex without intravenous contrast.

[Series 2: head without · axial · non-contrast · 0.43mm/px · z∈[-96,-6]mm · 4 of 32 slices shown (1 of 2)]
[im 7/32  brain]
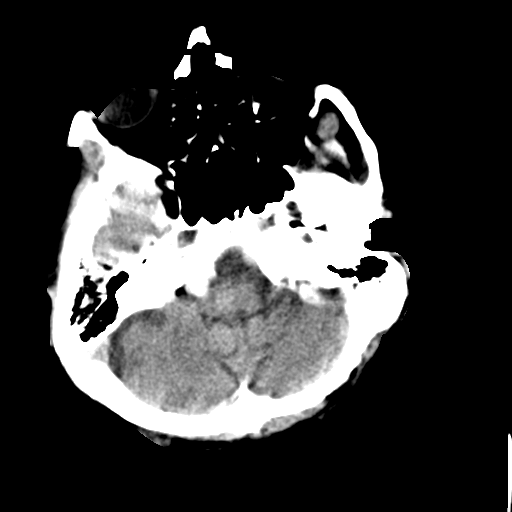
[im 13/32  brain]
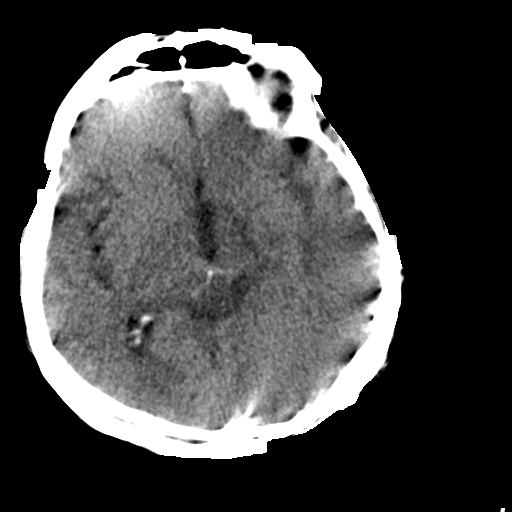
[im 19/32  brain]
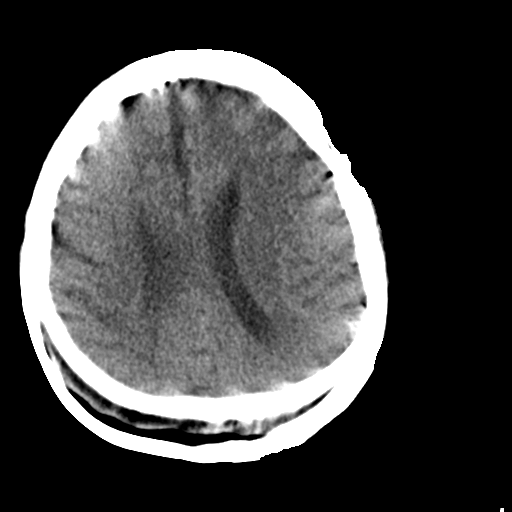
[im 25/32  brain]
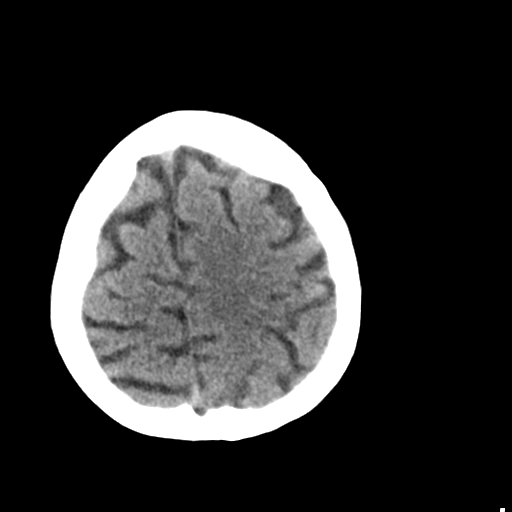

[Series 3: head without · axial · non-contrast · 0.43mm/px · z∈[-101,-1]mm · 5 of 32 slices shown, 7 images (2 of 2)]
[im 6/32  brain]
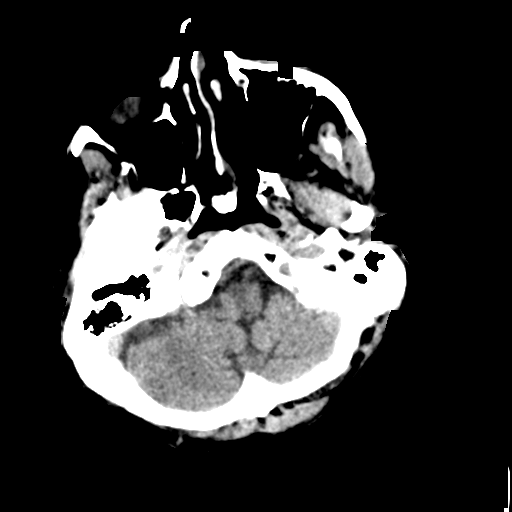
[im 6/32  bone]
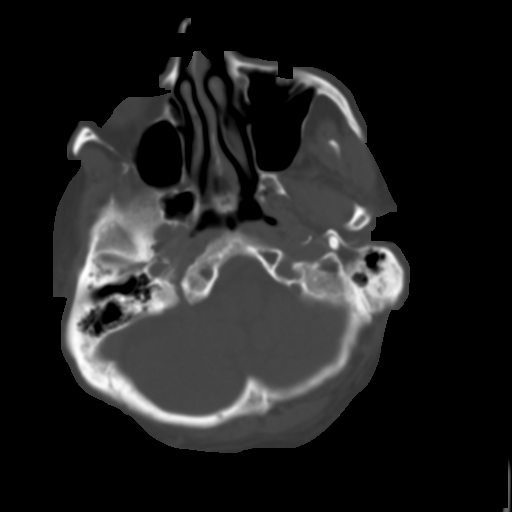
[im 11/32  brain]
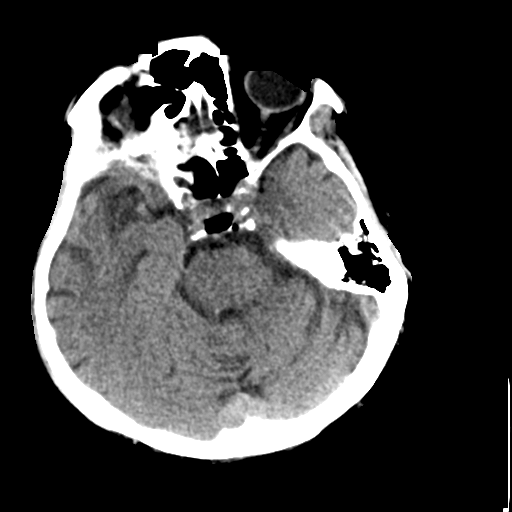
[im 16/32  brain]
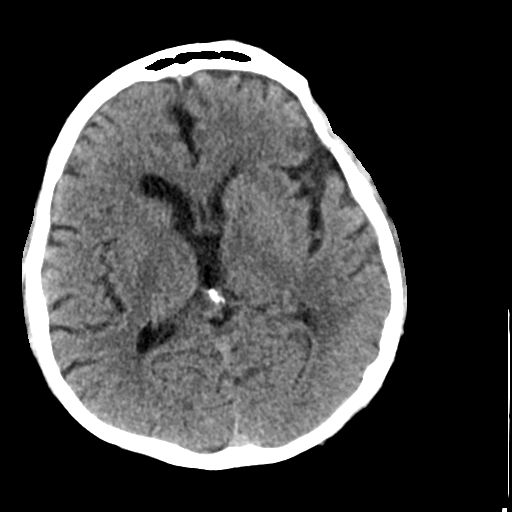
[im 21/32  brain]
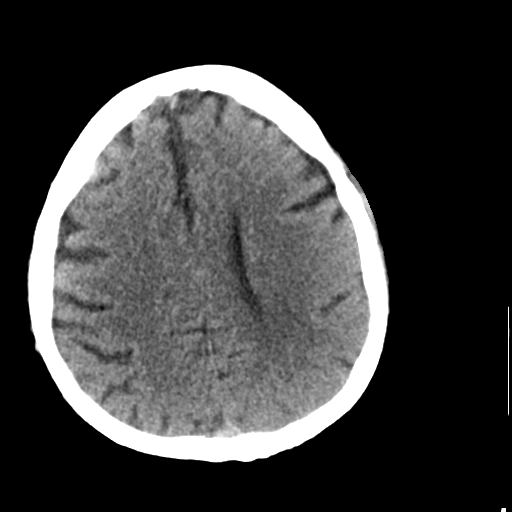
[im 26/32  brain]
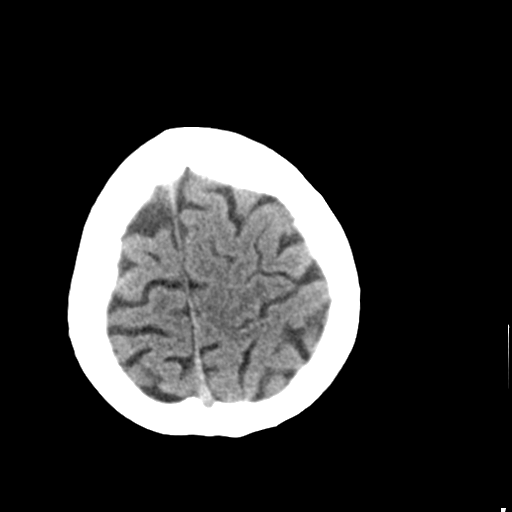
[im 26/32  bone]
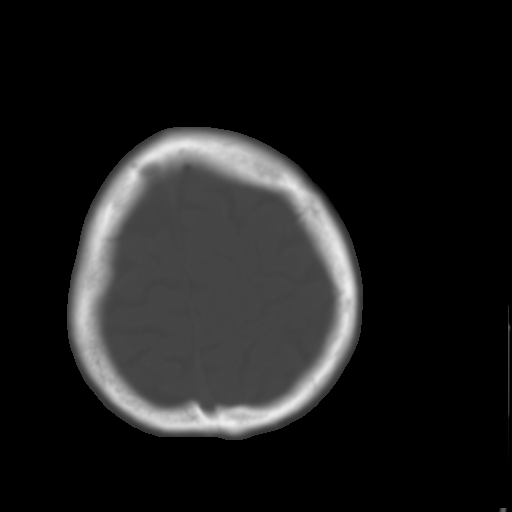

[Series 4: head bone · axial · 0.43mm/px · z∈[-118,+22]mm · 8 of 80 slices shown]
[im 5/80  bone]
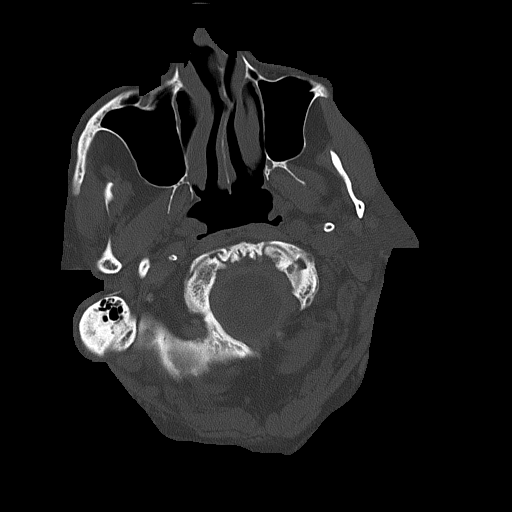
[im 15/80  bone]
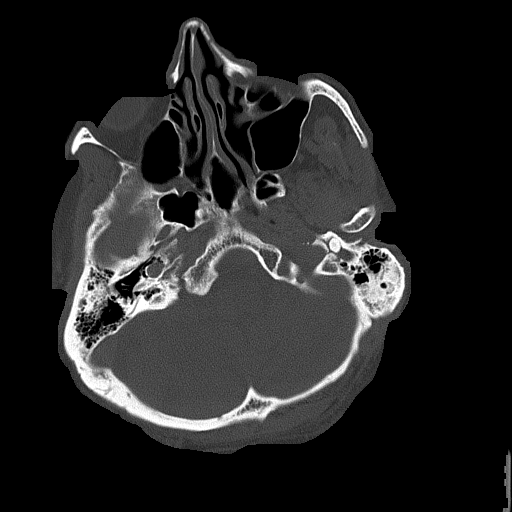
[im 25/80  bone]
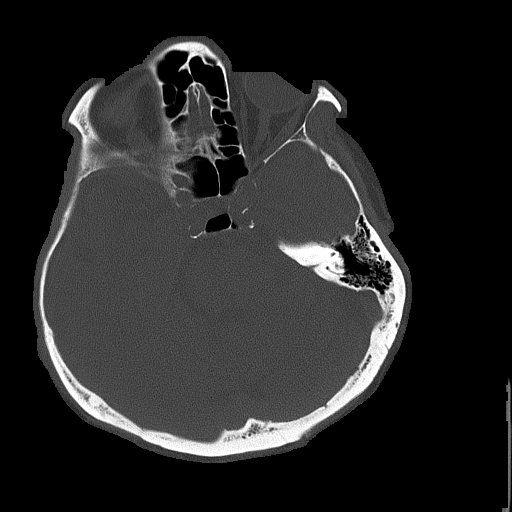
[im 35/80  bone]
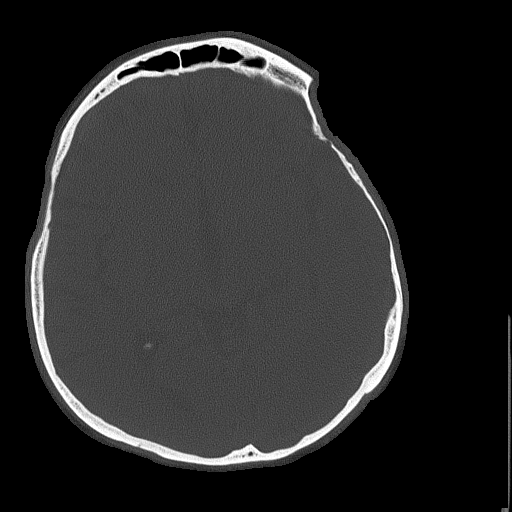
[im 45/80  bone]
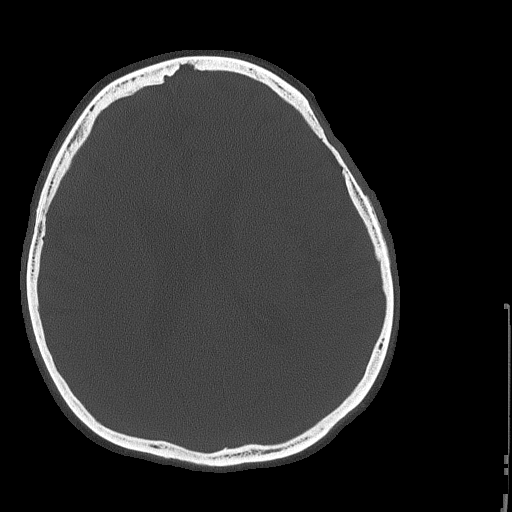
[im 55/80  bone]
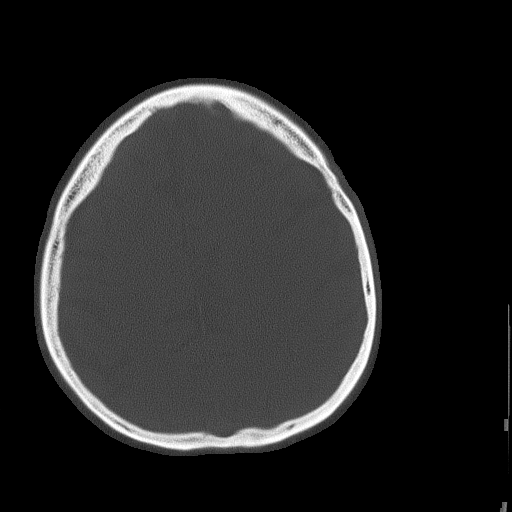
[im 65/80  bone]
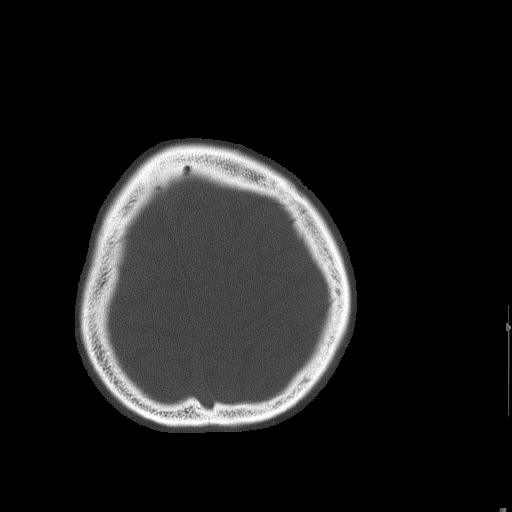
[im 75/80  bone]
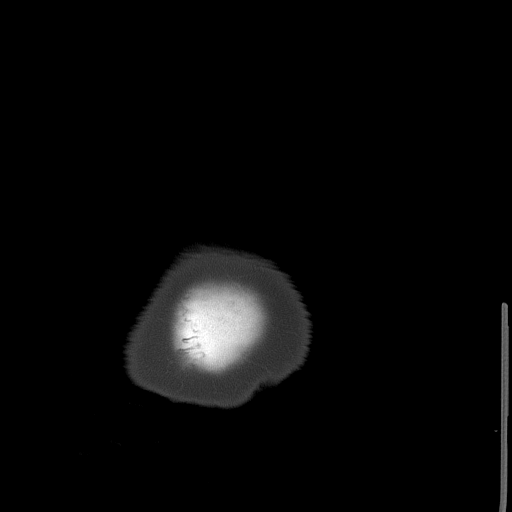

[17 of 30 positions shown; findings below may reference images not displayed]

FINDINGS: Stable paranasal sinuses and mastoids. Stable visualized osseous
structures. Stable orbit and scalp soft tissues. The patient no
longer has rightward gaze deviation.

Calcified atherosclerosis at the skull base. No acute cortically
based infarct identified. No intracranial mass effect. No acute
intracranial hemorrhage identified. No ventriculomegaly. Gray-white
matter differentiation appears stable and within normal limits
throughout the brain.
IMPRESSION: Stable since [RU] hr today and negative for age noncontrast CT
appearance of the brain.

## 2014-12-31 MED ORDER — ASPIRIN 325 MG PO TABS
325.0000 mg | ORAL_TABLET | Freq: Every day | ORAL | Status: DC
  Administered 2015-01-01 – 2015-01-03 (×3): 325 mg via ORAL
  Filled 2014-12-31 (×3): qty 1

## 2014-12-31 MED ORDER — PIPERACILLIN-TAZOBACTAM 3.375 G IVPB
3.3750 g | Freq: Three times a day (TID) | INTRAVENOUS | Status: DC
  Administered 2014-12-31 – 2015-01-01 (×3): 3.375 g via INTRAVENOUS
  Filled 2014-12-31 (×5): qty 50

## 2014-12-31 MED ORDER — LEVETIRACETAM 500 MG PO TABS
500.0000 mg | ORAL_TABLET | Freq: Two times a day (BID) | ORAL | Status: DC
  Administered 2014-12-31: 500 mg via ORAL
  Filled 2014-12-31 (×2): qty 1

## 2014-12-31 NOTE — Care Management Note (Signed)
Case Management Note  Patient Details  Name: Alexander Conley MRN: [id number] Date of Birth: 01/11/1941  Subjective/Objective:   Adm w encepalopathy                 Action/Plan: lives w fam, pcp dr Welton Flakeskhan   Expected Discharge Date:                  Expected Discharge Plan:     In-House Referral:     Discharge planning Services     Post Acute Care Choice:    Choice offered to:     DME Arranged:    DME Agency:     HH Arranged:    HH Agency:     Status of Service:     Medicare Important Message Given:    Date Medicare IM Given:    Medicare IM give by:    Date Additional Medicare IM Given:    Additional Medicare Important Message give by:     If discussed at Long Length of Stay Meetings, dates discussed:    Additional Comments: ur  Review done  Alexander Conley, Alexander Ilg T, RN 12/31/2014, 8:13 AM

## 2014-12-31 NOTE — Progress Notes (Signed)
[facility] TEAM 1 - Stepdown/ICU TEAM PROGRESS NOTE  Caroll Cunnington ZOX:[id number] DOB: [birth year] DOA: 12/30/2014 PCP: Lise Auer, MD  Admit HPI / Brief Narrative: 74 y.o. male with a history of orthotopic heart transplant in 2011, stage III chronic kidney disease, prostate CA, and a recent history of orthostatic hypotension who presented to the ED for evaluation of altered mental status. He had 3 hospitalizations recently at [facility] and at [facility] for evaluation of severe orthostatic hypotension. He was in his usual state of health until his wife noted that he was very difficult to arouse. EMS was called, patient was taken to [facility], where a CT of the head was negative, but per documentation from [facility] he had a tonic-clonic seizure. There was some question of right-sided deficits, therefore because of concern for acute CVA patient was then transferred to [facility]. CTA head and neck showed a possible right distal MCA occlusion but per Neurology this would not explain his symptoms. EEG was negative.   Per family, he was told at [facility] where he had his heart transplant that he cannot have a MRI. Per spouse-patient is bed to wheelchair bound and has been non ambulatory for the past few months.   HPI/Subjective: Multiple family members are in the room.  I have spent an extended period of time discussing the patient's condition with them.  They state that his mental status is improved compared to when he first presented but he is not yet back to baseline.  The patient is alert and will answer some questions but often times is incorrect in his responses.  He cannot provide a reliable review of systems.  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  W/u underway as per Neuro   ?HCAP  empirically treat with vancomycin and Primaxin for now -  suspect LLL findings are most likely atx  CKD stage III Creatinine close to baseline  HLD LDL 120 - tx per Neuro   Orthostatic hypotension - autonomic dysreflexia  long-standing issues with orthostatic hypotension over past few months - previously on Florinef but this was discontinued because of severe hypokalemia - has undergone a very extensive w/u at [facility] per his wife   Orthotopic heart transplant in 2011 Continue Prograf and prophylactic Bactrim - follows at [facility]   Code Status: FULL Family Communication: Spoke with wife and 2 daughters at length Disposition Plan: SDU   Consultants: Neuro   Procedures: EEG  Antibiotics: Primaxin 10/13 > Vancomycin 10/13 >  DVT prophylaxis: lovenox   Objective: Blood pressure 175/99, pulse 95, temperature 97.5 F (36.4 C), temperature source Oral, resp. rate 20, height 6' (1.829 m), weight 80.8 kg (178 lb 2.1 oz), SpO2 99 %.  Intake/Output Summary (Last 24 hours) at 12/31/14 1621 Last data filed at 12/31/14 1400  Gross per 24 hour  Intake   1438 ml  Output   1700 ml  Net   -262 ml   Exam: General: No acute respiratory distress - alert but confused Lungs: Clear to auscultation bilaterally without wheezes or crackles Cardiovascular: Regular rate and rhythm without murmur gallop or rub normal S1 and S2 Abdomen: Nontender, nondistended, soft, bowel sounds positive, no rebound, no ascites, no appreciable mass Extremities: No significant cyanosis, clubbing, or edema bilateral lower extremities  Data Reviewed: Basic Metabolic Panel:  Recent Labs Lab 12/30/14 1600 12/30/14 1611 12/30/14 2230 12/31/14 0241  NA 131*  132*  --  133*  K 4.8 4.7  --  4.6  CL 102 102  --  100*  CO2 22  --   --  21*  GLUCOSE 176* 176*  --  147*  BUN 35* 36*  --  29*  CREATININE 1.58* 1.40* 1.47* 1.40*  CALCIUM 9.0  --   --  9.6    CBC:  Recent Labs Lab 12/30/14 1600 12/30/14 1611 12/30/14 2230 12/31/14 0241  WBC 10.2  --  9.5 10.2    NEUTROABS 9.0*  --   --   --   HGB 11.0* 11.6* 11.2* 11.4*  HCT 32.1* 34.0* 33.2* 33.8*  MCV 85.8  --  86.0 86.2  PLT 141*  --  154 167    Liver Function Tests:  Recent Labs Lab 12/30/14 1600  AST 38  ALT 48  ALKPHOS 138*  BILITOT 0.4  PROT 7.2  ALBUMIN 3.3*    Coags:  Recent Labs Lab 12/30/14 1600  INR 1.25    Recent Labs Lab 12/30/14 1600  APTT 29    Recent Results (from the past 240 hour(s))  Blood culture (routine x 2)     Status: None (Preliminary result)   Collection Time: 12/30/14  5:08 PM  Result Value Ref Range Status   Specimen Description BLOOD LEFT HAND  Final   Special Requests BOTTLES DRAWN AEROBIC AND ANAEROBIC 4CC  Final   Culture NO GROWTH < 24 HOURS  Final   Report Status PENDING  Incomplete  Blood culture (routine x 2)     Status: None (Preliminary result)   Collection Time: 12/30/14  5:16 PM  Result Value Ref Range Status   Specimen Description BLOOD RIGHT HAND  Final   Special Requests BOTTLES DRAWN AEROBIC AND ANAEROBIC 5CC  Final   Culture NO GROWTH < 24 HOURS  Final   Report Status PENDING  Incomplete  MRSA PCR Screening     Status: None   Collection Time: 12/30/14 10:07 PM  Result Value Ref Range Status   MRSA by PCR NEGATIVE NEGATIVE Final    Comment:        The GeneXpert MRSA Assay (FDA approved for NASAL specimens only), is one component of a comprehensive MRSA colonization surveillance program. It is not intended to diagnose MRSA infection nor to guide or monitor treatment for MRSA infections.      Studies:   Recent x-ray studies have been reviewed in detail by the Attending Physician  Scheduled Meds:  Scheduled Meds: . enoxaparin (LOVENOX) injection  40 mg Subcutaneous Q24H  . famotidine  20 mg Oral Q1200  . finasteride  5 mg Oral Q1200  . imipenem-cilastatin  500 mg Intravenous 3 times per day  . levETIRAcetam  500 mg Oral BID  . midodrine  10 mg Oral TID WC  . multivitamin  1 tablet Oral Q1200  .  pravastatin  40 mg Oral QHS  . sertraline  50 mg Oral Q1200  . sodium chloride  3 mL Intravenous Q12H  . sodium chloride  1 g Oral QID  . sulfamethoxazole-trimethoprim  1 tablet Oral Q M,W,F  . tacrolimus  3 mg Oral Daily  . tacrolimus  4 mg Oral QHS  . vancomycin  750 mg Intravenous Q12H    Time spent on care of this patient: 35 mins   Katrice Goel T , MD   [facility] Office  [phone] Pager - Text Page per Loretha StaplerAmion as per below:  On-Call/Text Page:  [email or web address]      password [email or web address]  If 7PM-7AM, please contact night-coverage [email or web address] Password [email or web address] 12/31/2014, 4:21 PM   LOS: 1 day

## 2014-12-31 NOTE — Procedures (Signed)
ELECTROENCEPHALOGRAM REPORT  Date of Study: 12/31/2014  Patient's Name: Alexander OdeaGerald Conley MRN: [id number] Date of Birth: 02-17-1941  Referring Provider: Dr. Jeoffrey MassedShanker Ghimire  Clinical History: This is a 74 year old man with altered mental status.   Medications: levETIRAcetam (KEPPRA) tablet 500 mg famotidine (PEPCID) tablet 20 mg finasteride (PROSCAR) tablet 5 mg imipenem-cilastatin (PRIMAXIN) 500 mg in sodium chloride 0.9 % 100 mL IVPB midodrine (PROAMATINE) tablet 10 mg pravastatin (PRAVACHOL) tablet 40 mg sertraline (ZOLOFT) tablet 50 mg sulfamethoxazole-trimethoprim (BACTRIM,SEPTRA) 400-80 MG per tablet 1 tablet tacrolimus (PROGRAF) capsule 3 mg tacrolimus (PROGRAF) capsule 4 mg vancomycin (VANCOCIN) IVPB 750 mg/150 ml premix  Technical Summary: A multichannel digital EEG recording measured by the international 10-20 system with electrodes applied with paste and impedances below 5000 ohms performed as portable with EKG monitoring in an awake and confused patient.  Hyperventilation and photic stimulation were not performed.  The digital EEG was referentially recorded, reformatted, and digitally filtered in a variety of bipolar and referential montages for optimal display.   Description: The patient is awake and confused during the recording.  There is no clear posterior dominant rhythm. The background consists of a moderate amount of diffuse 4-5 Hz theta and 2-3 Hz delta slowing, majority of which is obscured by muscle artifact. Sleep architecture is not seen. Hyperventilation and photic stimulation were not performed. In between artifact, there are no epileptiform discharges or electrographic seizures seen.    EKG lead was unremarkable.  Impression: This limited awake EEG is abnormal due to moderate diffuse slowing of the waking background.  Clinical Correlation of the above findings indicates diffuse cerebral dysfunction that is non-specific in etiology and can be seen with  hypoxic/ischemic injury, toxic/metabolic encephalopathies, neurodegenerative disorders, post-ictal state, or medication effect. This EEG is limited by muscle artifact, but in between artifact there are no epileptiform discharges seen. The absence of epileptiform discharges does not rule out a clinical diagnosis of epilepsy.  Clinical correlation is advised.   Patrcia DollyKaren Tirrell Buchberger, M.D.

## 2014-12-31 NOTE — Evaluation (Signed)
Clinical/Bedside Swallow Evaluation Patient Details  Name: Alexander OdeaGerald Sellinger MRN: [id number] Date of Birth: 01/08/1941  Today's Date: 12/31/2014 Time: SLP Start Time (ACUTE ONLY): 0910 SLP Stop Time (ACUTE ONLY): 0926 SLP Time Calculation (min) (ACUTE ONLY): 16 min  Past Medical History:  Past Medical History  Diagnosis Date  . H/O heart transplant (HCC)   . Abdominal aortic aneurysm (AAA) (HCC)   . Seizures (HCC)    Past Surgical History:  Past Surgical History  Procedure Laterality Date  . Cardiac surgery      Heart Transplant  . Appendectomy    . Shoulder surgery    . Knee surgery    . Eye surgery      Cataract   HPI:  74 y.o. male with a Past Medical History of orthotopic heart transplant in 2011, stage III chronic kidney disease, CVA, admitted with altered mental status. [facility], suffered seizure and possible right sided deficits hen [facility]. CTA head stable since 1130 hr today and negative for age noncontrast. CXR Mild retrocardiac opacification, likely atelectasis. Given history of altered mental status, underlying aspiration should be considered.   Assessment / Plan / Recommendation Clinical Impression  Daughter (via phone) reported one week prior to admission of pocketing and some difficulty with pills (appeared more cognitively based). Oral delays adn decreased manipulation with solid. Suspect delayed initiation inconsistently with thin. No overt s/s aspiration or changes in vitals. Lethargy is primary risk at present. Recommend Dys 2 diet texture, thin liquids, no straws, full supervision and crush meds with prognosis good with improved alertness. ST will follow.     Aspiration Risk  Moderate    Diet Recommendation Dysphagia 2 (Fine chop);Thin   Medication Administration: Crushed with puree Compensations: Slow rate;Small sips/bites;Check for pocketing    Other  Recommendations Oral Care Recommendations: Oral care BID    Follow Up Recommendations       Frequency and Duration min 2x/week  2 weeks   Pertinent Vitals/Pain none    SLP Swallow Goals     Swallow Study Prior Functional Status       General Other Pertinent Information: 74 y.o. male with a Past Medical History of orthotopic heart transplant in 2011, stage III chronic kidney disease, CVA, admitted with altered mental status. [facility], suffered seizure and possible right sided deficits hen [facility]. CTA head stable since 1130 hr today and negative for age noncontrast. CXR Mild retrocardiac opacification, likely atelectasis. Given history of altered mental status, underlying aspiration should be considered. Type of Study: Bedside swallow evaluation Previous Swallow Assessment:  (none) Diet Prior to this Study: NPO Temperature Spikes Noted: Yes (minimal) Respiratory Status: Room air History of Recent Intubation: No Behavior/Cognition: Cooperative;Lethargic/Drowsy;Requires cueing;Distractible Oral Cavity - Dentition:  (upper denture plate, natural lower) Patient Positioning: Upright in bed Baseline Vocal Quality: Low vocal intensity Volitional Cough: Cognitively unable to elicit Volitional Swallow: Unable to elicit    Oral/Motor/Sensory Function Overall Oral Motor/Sensory Function:  (no focal deficits)   Ice Chips Ice chips: Impaired Oral Phase Impairments: Impaired anterior to posterior transit Oral Phase Functional Implications: Prolonged oral transit   Thin Liquid Thin Liquid: Impaired Presentation: Cup;Straw Pharyngeal  Phase Impairments: Suspected delayed Swallow    Nectar Thick Nectar Thick Liquid: Not tested   Honey Thick Honey Thick Liquid: Not tested   Puree Puree: Within functional limits   Solid   GO    Solid: Impaired Oral Phase Impairments: Impaired anterior to  posterior transit Oral Phase Functional Implications: Oral residue (min residue, mild prolonged transit)        [place] Lohmeyer, Breck Coons 12/31/2014,9:35 AM  Breck Coons Lonell Face.Ed [employer] [phone]

## 2014-12-31 NOTE — Progress Notes (Signed)
EEG Completed; Results Pending  

## 2014-12-31 NOTE — Evaluation (Signed)
Physical Therapy Evaluation Patient Details Name: Alexander OdeaGerald Conley MRN: [id number] DOB: 01/16/1941 Today's Date: 12/31/2014   History of Present Illness  74 y.o. male with history of baseline dementia presenting with AMS of unknown etiology.  At 0915 he was noted by his wife to be minimally unresponsive, not talking with question of right sided weakness. While in [facility] he was noted to have a tonic clonic seizure broke with 2mg  of ativan. Neuro workup underway  Clinical Impression  Patient demonstrates deficits in functional mobility as indicated below. Will need continued skilled PT to address deficits and maximize function. Will see as indicated and progress as tolerated. OF NOTE: will need to determine baseline level of function as well as level of caregiver assist to determine if safe for return home. If family unable to provide increased level of physical assist, patient will need ST SNF.     Follow Up Recommendations SNF;Supervision/Assistance - 24 hour;Supervision for mobility/OOB (SNF vs HOME with physical assist pending caregiver support)    Equipment Recommendations       Recommendations for Other Services       Precautions / Restrictions Precautions Precautions: Fall Restrictions Weight Bearing Restrictions: No      Mobility  Bed Mobility Overal bed mobility: Needs Assistance;+ 2 for safety/equipment Bed Mobility: Supine to Sit;Sit to Supine     Supine to sit: Mod assist Sit to supine: Mod assist   General bed mobility comments: assist to initiate LEs movements to EOB, increased assist to elevate trunk to upright, patient was able to perform some aspects of mobility, increased assist to return to bed (mostly secondary to cognitive deficits)  Transfers Overall transfer level: Needs assistance Equipment used: 2 person hand held assist Transfers: Sit to/from Stand Sit to Stand: Mod assist;+2 physical assistance         General transfer comment: Increased  physical assist to come to standing, patient with posterior lean but flexed posture. Difficulty centering mass over BOS despite faciliatation cues  Ambulation/Gait                Stairs            Wheelchair Mobility    Modified Rankin (Stroke Patients Only)       Balance Overall balance assessment: Needs assistance   Sitting balance-Leahy Scale: Poor Sitting balance - Comments: patient unable to maintain sitting balance without assist Postural control: Posterior lean;Left lateral lean Standing balance support: During functional activity;Bilateral upper extremity supported Standing balance-Leahy Scale: Zero Standing balance comment: 2 person max assist to maintain static standing                             Pertinent Vitals/Pain Pain Assessment: Faces Faces Pain Scale: No hurt    Home Living Family/patient expects to be discharged to:: Unsure Living Arrangements: Spouse/significant other                    Prior Function Level of Independence: Needs assistance         Comments: per history and chart review Baseline is :wheel chair bound, 2 person assist to transfer, needs assistance bathing, can feed himself, spends most days in bed or in chair     Hand Dominance        Extremity/Trunk Assessment               Lower Extremity Assessment: Generalized weakness;Difficult to assess due to impaired cognition (noted to have  active ROM bilateral LEs, 3-4/5)         Communication   Communication: Expressive difficulties  Cognition Arousal/Alertness: Awake/alert Behavior During Therapy: Restless Overall Cognitive Status: No family/caregiver present to determine baseline cognitive functioning                      General Comments      Exercises        Assessment/Plan    PT Assessment Patient needs continued PT services  PT Diagnosis Difficulty walking;Generalized weakness;Altered mental status   PT Problem  List Decreased strength;Decreased activity tolerance;Decreased balance;Decreased mobility;Decreased coordination;Decreased cognition;Decreased safety awareness  PT Treatment Interventions DME instruction;Functional mobility training;Therapeutic activities;Therapeutic exercise;Balance training;Cognitive remediation;Patient/family education   PT Goals (Current goals can be found in the Care Plan section) Acute Rehab PT Goals PT Goal Formulation: Patient unable to participate in goal setting Time For Goal Achievement: 01/14/15 Potential to Achieve Goals: Fair    Frequency Min 3X/week   Barriers to discharge        Co-evaluation               End of Session Equipment Utilized During Treatment: Gait belt Activity Tolerance: Patient limited by fatigue (transport present to take to test) Patient left: in bed;with call bell/phone within reach;Other (comment) (transport present) Nurse Communication: Mobility status         Time: 4098-1191 PT Time Calculation (min) (ACUTE ONLY): 16 min   Charges:   PT Evaluation $Initial PT Evaluation Tier I: 1 Procedure     PT G CodesFabio Asa 01/02/2015, 2:32 PM  Charlotte Crumb, PT DPT  [phone]

## 2014-12-31 NOTE — Progress Notes (Signed)
Subjective: More awake today able to follow commands but unable to express himself.   Objective: Current vital signs: BP 123/91 mmHg  Pulse 98  Temp(Src) 99.2 F (37.3 C) (Axillary)  Resp 27  Ht 6' (1.829 m)  Wt 80.8 kg (178 lb 2.1 oz)  BMI 24.15 kg/m2  SpO2 99% Vital signs in last 24 hours: Temp:  [97.1 F (36.2 C)-99.2 F (37.3 C)] 99.2 F (37.3 C) (10/14 0759) Pulse Rate:  [92-105] 98 (10/14 0759) Resp:  [13-27] 27 (10/14 0759) BP: (123-187)/(46-117) 123/91 mmHg (10/14 0759) SpO2:  [95 %-100 %] 99 % (10/14 0759) Weight:  [80.8 kg (178 lb 2.1 oz)-85.276 kg (188 lb)] 80.8 kg (178 lb 2.1 oz) (10/13 2200)  Intake/Output from previous day: 10/13 0701 - 10/14 0700 In: 830 [I.V.:375; IV Piggyback:455] Out: 1700 [Urine:1700] Intake/Output this shift:   Nutritional status: DIET DYS 2 Room service appropriate?: Yes; Fluid consistency:: Thin  Neurologic Exam: General: NAD Mental Status: Alert, not oriented to place or year.  Speech fluent without evidence of expressive aphasia.  Able to follow 3 step commands without difficulty. Cranial Nerves: II: Visual fields grossly normal, pupils equal, round, reactive to light and accommodation III,IV, VI: ptosis not present, extra-ocular motions intact bilaterally V,VII: smile symmetric, facial light touch sensation normal bilaterally VIII: hearing normal bilaterally IX,X: uvula rises symmetrically XI: bilateral shoulder shrug XII: midline tongue extension without atrophy or fasciculations  Motor: Moving all extremities well with no drift.  Sensory: Pinprick and light touch intact throughout, bilaterally    Lab Results: Basic Metabolic Panel:  Recent Labs Lab 12/30/14 1600 12/30/14 1611 12/30/14 2230 12/31/14 0241  NA 131* 132*  --  133*  K 4.8 4.7  --  4.6  CL 102 102  --  100*  CO2 22  --   --  21*  GLUCOSE 176* 176*  --  147*  BUN 35* 36*  --  29*  CREATININE 1.58* 1.40* 1.47* 1.40*  CALCIUM 9.0  --   --  9.6     Liver Function Tests:  Recent Labs Lab 12/30/14 1600  AST 38  ALT 48  ALKPHOS 138*  BILITOT 0.4  PROT 7.2  ALBUMIN 3.3*   No results for input(s): LIPASE, AMYLASE in the last 168 hours. No results for input(s): AMMONIA in the last 168 hours.  CBC:  Recent Labs Lab 12/30/14 1600 12/30/14 1611 12/30/14 2230 12/31/14 0241  WBC 10.2  --  9.5 10.2  NEUTROABS 9.0*  --   --   --   HGB 11.0* 11.6* 11.2* 11.4*  HCT 32.1* 34.0* 33.2* 33.8*  MCV 85.8  --  86.0 86.2  PLT 141*  --  154 167    Cardiac Enzymes: No results for input(s): CKTOTAL, CKMB, CKMBINDEX, TROPONINI in the last 168 hours.  Lipid Panel: No results for input(s): CHOL, TRIG, HDL, CHOLHDL, VLDL, LDLCALC in the last 168 hours.  CBG: No results for input(s): GLUCAP in the last 168 hours.  Microbiology: Results for orders placed or performed during the hospital encounter of 12/30/14  Blood culture (routine x 2)     Status: None (Preliminary result)   Collection Time: 12/30/14  5:08 PM  Result Value Ref Range Status   Specimen Description BLOOD LEFT HAND  Final   Special Requests BOTTLES DRAWN AEROBIC AND ANAEROBIC 4CC  Final   Culture PENDING  Incomplete   Report Status PENDING  Incomplete  Blood culture (routine x 2)     Status: None (Preliminary result)  Collection Time: 12/30/14  5:16 PM  Result Value Ref Range Status   Specimen Description BLOOD RIGHT HAND  Final   Special Requests BOTTLES DRAWN AEROBIC AND ANAEROBIC 5CC  Final   Culture PENDING  Incomplete   Report Status PENDING  Incomplete  MRSA PCR Screening     Status: None   Collection Time: 12/30/14 10:07 PM  Result Value Ref Range Status   MRSA by PCR NEGATIVE NEGATIVE Final    Comment:        The GeneXpert MRSA Assay (FDA approved for NASAL specimens only), is one component of a comprehensive MRSA colonization surveillance program. It is not intended to diagnose MRSA infection nor to guide or monitor treatment for MRSA  infections.     Coagulation Studies:  Recent Labs  12/30/14 1600  LABPROT 15.9*  INR 1.25    Imaging: Ct Angio Head W/cm &/or Wo Cm  12/30/2014  CLINICAL DATA:  74 year old male with left-sided weakness (per Dr. Hosie Poisson). Initial encounter. EXAM: CT ANGIOGRAPHY HEAD AND NECK TECHNIQUE: Multidetector CT imaging of the head and neck was performed using the standard protocol during bolus administration of intravenous contrast. Multiplanar CT image reconstructions and MIPs were obtained to evaluate the vascular anatomy. Carotid stenosis measurements (when applicable) are obtained utilizing NASCET criteria, using the distal internal carotid diameter as the denominator. CONTRAST:  50mL OMNIPAQUE IOHEXOL 350 MG/ML SOLN COMPARISON:  None. FINDINGS: CT HEAD Brain: Possibility of right middle cerebral artery distribution is raised. No intracranial hemorrhage or new intracranial enhancing lesion. No hydrocephalus. Calvarium and skull base: Negative. Paranasal sinuses: Clear. Orbits: Negative.  Post lens replacement. CTA NECK Aortic arch: Ectatic 3 vessel aortic arch. Calcified plaque with slight narrowing origin left common carotid artery and left subclavian artery. Right carotid system: Plaque right carotid bifurcation. Ectatic right internal carotid artery with irregular plaque contributing to 68% diameter stenosis proximal right internal carotid artery. Plaque distal cervical segment right internal carotid artery with less than 50% diameter stenosis. Left carotid system: Plaque left carotid bifurcation. 64% diameter stenosis proximal left internal carotid artery. Calcification distal left internal carotid artery cervical segment with less than 50% diameter stenosis. Vertebral arteries:Minimal narrowing origin of the right vertebral artery. Mild narrowing origin left vertebral artery. Right vertebral artery slightly dominant in size. Skeleton: Multilevel cervical spondylotic changes with various degrees of  spinal stenosis and foraminal narrowing. Other neck: No worrisome primary neck mass or lung apical lesion. CTA HEAD Anterior circulation: Atherosclerotic type changes cavernous segment of the internal carotid artery bilaterally. Mild narrowing right internal carotid artery cavernous segment with associated ectasia. Slightly prominent infundibulum without aneurysm. Mild to slightly moderate narrowing portion of the left internal carotid artery proximal cavernous segment of poststenotic ectasia. Questionable decrease number of visualized right middle cerebral artery branch vessels when compared to left. Middle cerebral artery branch vessel narrowing and irregularity bilaterally. Posterior circulation: Ectatic vertebral arteries without high-grade stenosis. Ectatic basilar artery with possible small fenestration without high-grade stenosis. Venous sinuses: Patent. Anatomic variants: Negative. Delayed phase: Not obtained as per Dr. Minus Breeding request. Exam reviewed with Dr. Wyatt Portela at the time of imaging. IMPRESSION: CT HEAD Possibility of right middle cerebral artery distribution is raised. No intracranial hemorrhage or new intracranial enhancing lesion. CTA NECK Ectatic right internal carotid artery with irregular plaque contributing to 68% diameter stenosis proximal right internal carotid artery. Plaque distal cervical segment right internal carotid artery with less than 50% diameter stenosis. 64% diameter stenosis proximal left internal carotid artery. Calcification distal left internal carotid  artery cervical segment with less than 50% diameter stenosis. Minimal narrowing origin of the right vertebral artery. Mild narrowing origin left vertebral artery. Right vertebral artery slightly dominant in size. CTA HEAD Atherosclerotic type changes cavernous segment of the internal carotid artery bilaterally. Mild narrowing right internal carotid artery cavernous segment with associated ectasia. Mild to slightly moderate  narrowing portion of the left internal carotid artery proximal cavernous segment of poststenotic ectasia. Questionable decrease number of visualized right middle cerebral artery branch vessels when compared to left. Middle cerebral artery branch vessel narrowing and irregularity bilaterally. Ectatic vertebral arteries without high-grade stenosis. Ectatic basilar artery with possible small fenestration without high-grade stenosis. Electronically Signed   By: Lacy Duverney M.D.   On: 12/30/2014 16:34   Ct Head Wo Contrast  12/31/2014  CLINICAL DATA:  74 year old male transferred from [facility] with stroke-like symptoms. Initial encounter. possible right MCA infarct. EXAM: CT HEAD WITHOUT CONTRAST TECHNIQUE: Contiguous axial images were obtained from the base of the skull through the vertex without intravenous contrast. COMPARISON:  CTA head and neck 1539 hr today, and [facility] head CT 1130 hr today. FINDINGS: Stable paranasal sinuses and mastoids. Stable visualized osseous structures. Stable orbit and scalp soft tissues. The patient no longer has rightward gaze deviation. Calcified atherosclerosis at the skull base. No acute cortically based infarct identified. No intracranial mass effect. No acute intracranial hemorrhage identified. No ventriculomegaly. Gray-white matter differentiation appears stable and within normal limits throughout the brain. IMPRESSION: Stable since 1130 hr today and negative for age noncontrast CT appearance of the brain. Electronically Signed   By: Odessa Fleming M.D.   On: 12/31/2014 00:34   Ct Angio Neck W/cm &/or Wo/cm  12/30/2014  CLINICAL DATA:  74 year old male with left-sided weakness (per Dr. Hosie Poisson). Initial encounter. EXAM: CT ANGIOGRAPHY HEAD AND NECK TECHNIQUE: Multidetector CT imaging of the head and neck was performed using the standard protocol during bolus administration of intravenous contrast. Multiplanar CT image reconstructions and MIPs were obtained to  evaluate the vascular anatomy. Carotid stenosis measurements (when applicable) are obtained utilizing NASCET criteria, using the distal internal carotid diameter as the denominator. CONTRAST:  50mL OMNIPAQUE IOHEXOL 350 MG/ML SOLN COMPARISON:  None. FINDINGS: CT HEAD Brain: Possibility of right middle cerebral artery distribution is raised. No intracranial hemorrhage or new intracranial enhancing lesion. No hydrocephalus. Calvarium and skull base: Negative. Paranasal sinuses: Clear. Orbits: Negative.  Post lens replacement. CTA NECK Aortic arch: Ectatic 3 vessel aortic arch. Calcified plaque with slight narrowing origin left common carotid artery and left subclavian artery. Right carotid system: Plaque right carotid bifurcation. Ectatic right internal carotid artery with irregular plaque contributing to 68% diameter stenosis proximal right internal carotid artery. Plaque distal cervical segment right internal carotid artery with less than 50% diameter stenosis. Left carotid system: Plaque left carotid bifurcation. 64% diameter stenosis proximal left internal carotid artery. Calcification distal left internal carotid artery cervical segment with less than 50% diameter stenosis. Vertebral arteries:Minimal narrowing origin of the right vertebral artery. Mild narrowing origin left vertebral artery. Right vertebral artery slightly dominant in size. Skeleton: Multilevel cervical spondylotic changes with various degrees of spinal stenosis and foraminal narrowing. Other neck: No worrisome primary neck mass or lung apical lesion. CTA HEAD Anterior circulation: Atherosclerotic type changes cavernous segment of the internal carotid artery bilaterally. Mild narrowing right internal carotid artery cavernous segment with associated ectasia. Slightly prominent infundibulum without aneurysm. Mild to slightly moderate narrowing portion of the left internal carotid artery proximal cavernous segment of  poststenotic ectasia.  Questionable decrease number of visualized right middle cerebral artery branch vessels when compared to left. Middle cerebral artery branch vessel narrowing and irregularity bilaterally. Posterior circulation: Ectatic vertebral arteries without high-grade stenosis. Ectatic basilar artery with possible small fenestration without high-grade stenosis. Venous sinuses: Patent. Anatomic variants: Negative. Delayed phase: Not obtained as per Dr. Minus Breeding request. Exam reviewed with Dr. Wyatt Portela at the time of imaging. IMPRESSION: CT HEAD Possibility of right middle cerebral artery distribution is raised. No intracranial hemorrhage or new intracranial enhancing lesion. CTA NECK Ectatic right internal carotid artery with irregular plaque contributing to 68% diameter stenosis proximal right internal carotid artery. Plaque distal cervical segment right internal carotid artery with less than 50% diameter stenosis. 64% diameter stenosis proximal left internal carotid artery. Calcification distal left internal carotid artery cervical segment with less than 50% diameter stenosis. Minimal narrowing origin of the right vertebral artery. Mild narrowing origin left vertebral artery. Right vertebral artery slightly dominant in size. CTA HEAD Atherosclerotic type changes cavernous segment of the internal carotid artery bilaterally. Mild narrowing right internal carotid artery cavernous segment with associated ectasia. Mild to slightly moderate narrowing portion of the left internal carotid artery proximal cavernous segment of poststenotic ectasia. Questionable decrease number of visualized right middle cerebral artery branch vessels when compared to left. Middle cerebral artery branch vessel narrowing and irregularity bilaterally. Ectatic vertebral arteries without high-grade stenosis. Ectatic basilar artery with possible small fenestration without high-grade stenosis. Electronically Signed   By: Lacy Duverney M.D.   On: 12/30/2014 16:34    Dg Chest Port 1 View  12/30/2014  CLINICAL DATA:  Altered mental status and right-sided weakness EXAM: PORTABLE CHEST 1 VIEW COMPARISON:  12/30/2014 from [facility]. FINDINGS: Streaky retrocardiac opacity, favoring atelectasis given morphology. Borderline cardiomegaly. Negative aortic and hilar contours. Patient has undergone median sternotomy, reportedly for heart transplant. No edema, effusion, or air leak. Symmetric biapical pleural scarring. IMPRESSION: Mild retrocardiac opacification, likely atelectasis. Given history of altered mental status, underlying aspiration should be considered. Electronically Signed   By: Marnee Spring M.D.   On: 12/30/2014 16:12    Medications:  Scheduled: . enoxaparin (LOVENOX) injection  40 mg Subcutaneous Q24H  . famotidine  20 mg Oral Q1200  . finasteride  5 mg Oral Q1200  . imipenem-cilastatin  500 mg Intravenous 3 times per day  . levETIRAcetam  500 mg Intravenous BID  . midodrine  10 mg Oral TID WC  . multivitamin  1 tablet Oral Q1200  . pravastatin  40 mg Oral QHS  . sertraline  50 mg Oral Q1200  . sodium chloride  3 mL Intravenous Q12H  . sodium chloride  1 g Oral QID  . sulfamethoxazole-trimethoprim  1 tablet Oral Q M,W,F  . tacrolimus  3 mg Oral Daily  . tacrolimus  4 mg Oral QHS  . vancomycin  750 mg Intravenous Q12H    Assessment/Plan: 74 YO male presenting with AMS and right sided weakness.  Patient is more alert today but shows significant expressive aphasia. patient likely has suffered a acute infarct effecting his speech.  Would recommend continuing stroke work up including Echo, A1c and FLP.  Stroke team to follow.   Felicie Morn PA-C [facility] [phone]  12/31/2014, 10:22 AM   Patient seen and evaluated. Agree with above assessment and plan. Mental status improved but still with continued speech deficits. Question possible MCA infarct not showing up on CT scan. Unable to get MRI. Patient started on keppra  for possible seizure  episode in outside ED.   Elspeth Cho, DO [facility] [phone]  If 7pm- 7am, please page neurology on call as listed in AMION.

## 2015-01-01 ENCOUNTER — Other Ambulatory Visit (HOSPITAL_COMMUNITY): Payer: Medicare Other

## 2015-01-01 DIAGNOSIS — F05 Delirium due to known physiological condition: Secondary | ICD-10-CM

## 2015-01-01 LAB — COMPREHENSIVE METABOLIC PANEL
ALT: 35 U/L (ref 17–63)
AST: 30 U/L (ref 15–41)
Albumin: 3.2 g/dL — ABNORMAL LOW (ref 3.5–5.0)
Alkaline Phosphatase: 121 U/L (ref 38–126)
Anion gap: 6 (ref 5–15)
BILIRUBIN TOTAL: 0.7 mg/dL (ref 0.3–1.2)
BUN: 32 mg/dL — AB (ref 6–20)
CO2: 25 mmol/L (ref 22–32)
CREATININE: 1.65 mg/dL — AB (ref 0.61–1.24)
Calcium: 9.4 mg/dL (ref 8.9–10.3)
Chloride: 103 mmol/L (ref 101–111)
GFR calc Af Amer: 45 mL/min — ABNORMAL LOW (ref >60)
GFR, EST NON AFRICAN AMERICAN: 39 mL/min — AB (ref >60)
GLUCOSE: 110 mg/dL — AB (ref 65–99)
Potassium: 4.4 mmol/L (ref 3.5–5.1)
Sodium: 134 mmol/L — ABNORMAL LOW (ref 135–145)
TOTAL PROTEIN: 6.9 g/dL (ref 6.5–8.1)

## 2015-01-01 LAB — CBC
HCT: 28.4 % — ABNORMAL LOW (ref 39.0–52.0)
Hemoglobin: 9.5 g/dL — ABNORMAL LOW (ref 13.0–17.0)
MCH: 29.2 pg (ref 26.0–34.0)
MCHC: 33.5 g/dL (ref 30.0–36.0)
MCV: 87.4 fL (ref 78.0–100.0)
PLATELETS: 158 10*3/uL (ref 150–400)
RBC: 3.25 MIL/uL — ABNORMAL LOW (ref 4.22–5.81)
RDW: 14.6 % (ref 11.5–15.5)
WBC: 9.6 10*3/uL (ref 4.0–10.5)

## 2015-01-01 LAB — IRON AND TIBC
Iron: 35 ug/dL — ABNORMAL LOW (ref 45–182)
SATURATION RATIOS: 14 % — AB (ref 17.9–39.5)
TIBC: 246 ug/dL — AB (ref 250–450)
UIBC: 211 ug/dL

## 2015-01-01 LAB — RETICULOCYTES
RBC.: 3.25 MIL/uL — ABNORMAL LOW (ref 4.22–5.81)
RETIC CT PCT: 1.4 % (ref 0.4–3.1)
Retic Count, Absolute: 45.5 10*3/uL (ref 19.0–186.0)

## 2015-01-01 LAB — TACROLIMUS LEVEL: Tacrolimus (FK506) - LabCorp: 7.5 ng/mL (ref 2.0–20.0)

## 2015-01-01 LAB — HEMOGLOBIN A1C
Hgb A1c MFr Bld: 5.5 % (ref 4.8–5.6)
Mean Plasma Glucose: 111 mg/dL

## 2015-01-01 LAB — FOLATE: Folate: 16.7 ng/mL (ref >5.9)

## 2015-01-01 LAB — AMMONIA: AMMONIA: 16 umol/L (ref 9–35)

## 2015-01-01 LAB — VITAMIN B12: Vitamin B-12: 466 pg/mL (ref 180–914)

## 2015-01-01 LAB — FERRITIN: FERRITIN: 243 ng/mL (ref 24–336)

## 2015-01-01 MED ORDER — ATORVASTATIN CALCIUM 80 MG PO TABS
80.0000 mg | ORAL_TABLET | Freq: Every day | ORAL | Status: DC
  Administered 2015-01-01: 80 mg via ORAL
  Filled 2015-01-01 (×2): qty 1

## 2015-01-01 MED ORDER — LEVETIRACETAM 100 MG/ML PO SOLN
500.0000 mg | Freq: Two times a day (BID) | ORAL | Status: DC
  Administered 2015-01-01 – 2015-01-03 (×5): 500 mg via ORAL
  Filled 2015-01-01 (×9): qty 5

## 2015-01-01 NOTE — Progress Notes (Signed)
[facility] TEAM 1 - Stepdown/ICU TEAM PROGRESS NOTE  Antony OdeaGerald Sou VOZ:[id number] DOB: 04/22/1940 DOA: 12/30/2014 PCP: Lise AuerKHAN,JABER A, MD  Admit HPI / Brief Narrative: 74 y.o. male with a history of orthotopic heart transplant in 2011, stage III chronic kidney disease, prostate CA, and a recent history of orthostatic hypotension who presented to the ED for evaluation of altered mental status. [facility] and [facility] for evaluation of severe orthostatic hypotension. He was in his usual state of health until his wife noted that he was very difficult to arouse. EMS was called, [facility], where a CT of the head was negative, but per [facility] he had a tonic-clonic seizure. There was some question of right-sided deficits, therefore because of concern for acute CVA patient was then [facility]. CTA head and neck showed a possible right distal MCA occlusion but this would not explain his symptoms. EEG was negative.   Per family, he was told he cannot have a MRI due to a metallic mesh aortic patch.    HPI/Subjective: The patient is sleeping at the time of visit.  When I awaken him he does appear to be more oriented and his speech appears to be more clear.  He moves all 4 [place] is evenly and without apparent difficulty.  There is no family present at time of visit.  Assessment/Plan:  Encephalopathy acute suspect seizures are the best explanation for his confusion - empirically started on Keppra - appears to be slowly improving   Possible R MCA infarct  CTA suggests possible diminished flow in R MCA distribution, but this does not match his presenting sx of dysarthria - f/u CT notes not clear evidence of actual infarct   ?HCAP - doubt  empirically treated with vancomycin and Primaxin initially - suspect LLL findings are most likely atx - with no fever and normal WBC will stop abx and  follow clinically   CKD stage III Monitor crt trend   HLD LDL 120 - now on lipitor   Orthostatic hypotension - autonomic dysreflexia  long-standing issues with orthostatic hypotension over past few months - previously on Florinef but this was discontinued because of severe hypokalemia - has undergone a very extensive w[facility] per his wife   Orthotopic heart transplant in 2011 Continue Prograf and prophylactic Bactrim - [facility]   Code Status: FULL Family Communication: no family present at time of visit today Disposition Plan: SDU   Consultants: Neuro   Procedures: EEG  Antibiotics: Primaxin 10/13 > 10/15 Vancomycin 10/13 > 10/15  DVT prophylaxis: lovenox   Objective: Blood pressure 163/89, pulse 79, temperature 97 F (36.1 C), temperature source Axillary, resp. rate 16, height 6' (1.829 m), weight 81.4 kg (179 lb 7.3 oz), SpO2 99 %.  Intake/Output Summary (Last 24 hours) at 01/01/15 1533 Last data filed at 01/01/15 1047  Gross per 24 hour  Intake   1203 ml  Output    475 ml  Net    728 ml   Exam: General: No acute respiratory distress - less confused  Lungs: Clear to auscultation bilaterally without wheezes or crackles Cardiovascular: Regular rate and rhythm without murmur gallop or rub Abdomen: Nontender, nondistended, soft, bowel sounds positive, no rebound, no ascites, no appreciable mass Extremities: No significant cyanosis, clubbing, or edema bilateral lower extremities  Data Reviewed: Basic Metabolic Panel:  Recent Labs Lab 12/30/14 1600 12/30/14 1611 12/30/14 2230 12/31/14  0241 01/01/15 0319  NA 131* 132*  --  133* 134*  K 4.8 4.7  --  4.6 4.4  CL 102 102  --  100* 103  CO2 22  --   --  21* 25  GLUCOSE 176* 176*  --  147* 110*  BUN 35* 36*  --  29* 32*  CREATININE 1.58* 1.40* 1.47* 1.40* 1.65*  CALCIUM 9.0  --   --  9.6 9.4    CBC:  Recent Labs Lab 12/30/14 1600 12/30/14 1611 12/30/14 2230 12/31/14 0241 01/01/15 0319  WBC  10.2  --  9.5 10.2 9.6  NEUTROABS 9.0*  --   --   --   --   HGB 11.0* 11.6* 11.2* 11.4* 9.5*  HCT 32.1* 34.0* 33.2* 33.8* 28.4*  MCV 85.8  --  86.0 86.2 87.4  PLT 141*  --  154 167 158    Liver Function Tests:  Recent Labs Lab 12/30/14 1600 01/01/15 0319  AST 38 30  ALT 48 35  ALKPHOS 138* 121  BILITOT 0.4 0.7  PROT 7.2 6.9  ALBUMIN 3.3* 3.2*    Coags:  Recent Labs Lab 12/30/14 1600  INR 1.25    Recent Labs Lab 12/30/14 1600  APTT 29    Recent Results (from the past 240 hour(s))  Blood culture (routine x 2)     Status: None (Preliminary result)   Collection Time: 12/30/14  5:08 PM  Result Value Ref Range Status   Specimen Description BLOOD LEFT HAND  Final   Special Requests BOTTLES DRAWN AEROBIC AND ANAEROBIC 4CC  Final   Culture NO GROWTH 2 DAYS  Final   Report Status PENDING  Incomplete  Blood culture (routine x 2)     Status: None (Preliminary result)   Collection Time: 12/30/14  5:16 PM  Result Value Ref Range Status   Specimen Description BLOOD RIGHT HAND  Final   Special Requests BOTTLES DRAWN AEROBIC AND ANAEROBIC 5CC  Final   Culture NO GROWTH 2 DAYS  Final   Report Status PENDING  Incomplete  MRSA PCR Screening     Status: None   Collection Time: 12/30/14 10:07 PM  Result Value Ref Range Status   MRSA by PCR NEGATIVE NEGATIVE Final    Comment:        The GeneXpert MRSA Assay (FDA approved for NASAL specimens only), is one component of a comprehensive MRSA colonization surveillance program. It is not intended to diagnose MRSA infection nor to guide or monitor treatment for MRSA infections.      Studies:   Recent x-ray studies have been reviewed in detail by the Attending Physician  Scheduled Meds:  Scheduled Meds: . aspirin  325 mg Oral Daily  . atorvastatin  80 mg Oral q1800  . enoxaparin (LOVENOX) injection  40 mg Subcutaneous Q24H  . famotidine  20 mg Oral Q1200  . finasteride  5 mg Oral Q1200  . levETIRAcetam  500 mg Oral  BID  . midodrine  10 mg Oral TID WC  . multivitamin  1 tablet Oral Q1200  . piperacillin-tazobactam  3.375 g Intravenous 3 times per day  . sertraline  50 mg Oral Q1200  . sodium chloride  3 mL Intravenous Q12H  . sodium chloride  1 g Oral QID  . sulfamethoxazole-trimethoprim  1 tablet Oral Q M,W,F  . tacrolimus  3 mg Oral Daily  . tacrolimus  4 mg Oral QHS  . vancomycin  750 mg Intravenous Q12H    Time  spent on care of this patient: 35 mins   [facility] T , MD   [facility] Office  [phone] Pager - Text Page per Amion as per below:  On-Call/Text Page:      [email or web address]      password [email or web address]  If 7PM-7AM, please contact night-coverage [email or web address] Password [email or web address] 01/01/2015, 3:33 PM   LOS: 2 days

## 2015-01-01 NOTE — Progress Notes (Signed)
STROKE TEAM PROGRESS NOTE   HISTORY Alexander Conley is an 74 y.o. male who awoke this AM at 0830 and per wife was his baseline. Baseline is :wheel chair bound, 2 person assist to transfer, needs assistance bathing, can feed himself, spends most days in bed or in chair. At 0915 he was noted by his wife to have AMS not talking or following commands. He was brought to [facility] where he was noted to have a TC seizure and given 2 mg Ativan. On arrival patient is obtunded, follows no commands, able to breath on his own and withdraws from pain on the left not the right. CTA of head was obtained which showed a possible distal right MCA occlusion which would not explain his symptoms. EEG stat was called.   He cannot have MRI due to metal in his body  Date last known well: Date: 12/30/2014 Time last known well: Time: 09:15 tPA Given: No: out of window   SUBJECTIVE (INTERVAL HISTORY) His RN is at the bedside.  Overall he feels his condition is rapidly improving. He is able to converse although paucity of speech, able to move all extremities symmetrically.    OBJECTIVE Temp:  [97.5 F (36.4 C)-99.2 F (37.3 C)] 97.8 F (36.6 C) (10/15 0318) Pulse Rate:  [86-101] 86 (10/15 0318) Cardiac Rhythm:  [-] Normal sinus rhythm (10/15 0700) Resp:  [19-27] 19 (10/15 0318) BP: (123-175)/(86-99) 138/86 mmHg (10/15 0318) SpO2:  [99 %] 99 % (10/14 1226) Weight:  [81.4 kg (179 lb 7.3 oz)] 81.4 kg (179 lb 7.3 oz) (10/15 0506)  CBC:  Recent Labs Lab 12/30/14 1600  12/31/14 0241 01/01/15 0319  WBC 10.2  < > 10.2 9.6  NEUTROABS 9.0*  --   --   --   HGB 11.0*  < > 11.4* 9.5*  HCT 32.1*  < > 33.8* 28.4*  MCV 85.8  < > 86.2 87.4  PLT 141*  < > 167 158  < > = values in this interval not displayed.  Basic Metabolic Panel:  Recent Labs Lab 12/31/14 0241 01/01/15 0319  NA 133* 134*  K 4.6 4.4  CL 100* 103  CO2 21* 25  GLUCOSE 147* 110*  BUN 29* 32*  CREATININE 1.40* 1.65*  CALCIUM 9.6 9.4     Lipid Panel:    Component Value Date/Time   CHOL 185 12/31/2014 1300   TRIG 168* 12/31/2014 1300   HDL 31* 12/31/2014 1300   CHOLHDL 6.0 12/31/2014 1300   VLDL 34 12/31/2014 1300   LDLCALC 120* 12/31/2014 1300   HgbA1c:  Lab Results  Component Value Date   HGBA1C 5.5 12/31/2014   Urine Drug Screen:    Component Value Date/Time   LABOPIA NONE DETECTED 12/30/2014 1815   COCAINSCRNUR NONE DETECTED 12/30/2014 1815   LABBENZ NONE DETECTED 12/30/2014 1815   AMPHETMU NONE DETECTED 12/30/2014 1815   THCU NONE DETECTED 12/30/2014 1815   LABBARB NONE DETECTED 12/30/2014 1815      IMAGING  Ct Angio Head and Neck W/cm &/or Wo Cm 12/30/2014    CT HEAD Possibility of right middle cerebral artery distribution is raised. No intracranial hemorrhage or new intracranial enhancing lesion.   CTA NECK  Ectatic right internal carotid artery with irregular plaque contributing to 68% diameter stenosis proximal right internal carotid artery. Plaque distal cervical segment right internal carotid artery with less than 50% diameter stenosis. 64% diameter stenosis proximal left internal carotid artery. Calcification distal left internal carotid artery cervical segment with less than  50% diameter stenosis. Minimal narrowing origin of the right vertebral artery. Mild narrowing origin left vertebral artery. Right vertebral artery slightly dominant in size.   CTA HEAD  Atherosclerotic type changes cavernous segment of the internal carotid artery bilaterally. Mild narrowing right internal carotid artery cavernous segment with associated ectasia. Mild to slightly moderate narrowing portion of the left internal carotid artery proximal cavernous segment of poststenotic ectasia. Questionable decrease number of visualized right middle cerebral artery branch vessels when compared to left. Middle cerebral artery branch vessel narrowing and irregularity bilaterally. Ectatic vertebral arteries without high-grade  stenosis. Ectatic basilar artery with possible small fenestration without high-grade stenosis.   Ct Head Wo Contrast 12/31/2014   Stable since 1130 hr today and negative for age noncontrast CT appearance of the brain.    Dg Chest Port 1 View 12/30/2014   Mild retrocardiac opacification, likely atelectasis. Given history of altered mental status, underlying aspiration should be considered.   2D echo pending  PHYSICAL EXAM  Temp:  [97 F (36.1 C)-98.7 F (37.1 C)] 97 F (36.1 C) (10/15 1257) Pulse Rate:  [79-96] 79 (10/15 1257) Resp:  [11-24] 16 (10/15 1257) BP: (103-175)/(82-99) 163/89 mmHg (10/15 1257) Weight:  [179 lb 7.3 oz (81.4 kg)] 179 lb 7.3 oz (81.4 kg) (10/15 0506)  General - Well nourished, well developed, in no apparent distress.  Ophthalmologic - Fundi not visualized due to noncooperation.  Cardiovascular - Regular rate and rhythm.  Mental Status -  Level of arousal and orientation to his name, and place were intact, not orientated to age and time and president. Language exam showed paucity of speech, but able to follow simple commands, naming 2/3 and able to repeat. Fund of Knowledge was assessed and was impaired.  Cranial Nerves II - XII - II - blink to visual threat bilaterally. III, IV, VI - Extraocular movements intact. V - Facial sensation intact bilaterally. VII - Facial movement intact bilaterally. VIII - Hearing & vestibular intact bilaterally. X - Palate elevates symmetrically. XI - Chin turning & shoulder shrug intact bilaterally. XII - Tongue protrusion intact.  Motor Strength - The patient's strength was symmetrical in all extremities and pronator drift was absent.  Bulk was normal and fasciculations were absent.   Motor Tone - Muscle tone was assessed at the neck and appendages and was normal.  Reflexes - The patient's reflexes were 1+ in all extremities and he had no pathological reflexes.  Sensory - Light touch, temperature/pinprick were  assessed and were symmetrical.    Coordination - not cooperative on exam.  Tremor was absent.  Gait and Station - not tested due to safety concerns   ASSESSMENT/PLAN Alexander Conley is a 74 y.o. male with history of heart transplant on tacrolimus, AAA, and seizure disorder presenting with altered mental status and tonic clonic seizure. He did not receive IV t-PA due to late presentation.  Seizure with post ictal - no evidence of stroke  Resultant AMS  MRI / MRA - not performed due to metal in his body  2D Echo - pending  CTA neck - 68% stenosis proximal RICA and 64% stenosis proximal left ICA. Need close follow up. No intervention needed at this time  Repeat CT head - no evidence of stroke  EEG diffuse slow, no seizure   LDL - 120  HgbA1c - 5.5  VTE prophylaxis - Lovenox  DIET DYS 2 Room service appropriate?: Yes; Fluid consistency:: Thin  no antithrombotic prior to admission, now on aspirin 325 mg orally  every day.  Pt was put on keppra 500mg  bid. Continue keppra until follow up in clinic  Patient counseled to be compliant with his antithrombotic medications  Ongoing aggressive stroke risk factor management  Therapy recommendations: Pending  Disposition: Pending  AMS   Could be due to seizure and prolonged post ictal  EEG diffuse slowing no seizure  On keppra, continue keppra until follow up in clinic.  Ammonia level repeat normal  Tacrolimus level pending  CT no evidence of infarct.  Hypertension  Blood pressure low this a.m. 103/82 otherwise stable  BP goal normotensive  Avoid hypotension  Hyperlipidemia  Home meds: Pravachol 40 mg daily resumed in hospital  LDL 120, goal < 70  Change to Lipitor 80 mg daily  Continue statin at discharge  Other Stroke Risk Factors  Advanced age  Cigarette smoker, quit smoking.  ETOH use   Other Active Problems  Possible aspiration pneumonia by chest x-ray  dementia  Anemia  Renal  insufficiency  Hospital day # 2  Neurology will sign off. Please call with questions. Pt will follow up [facility] Rod[facility]Xu [facility] in about 2 months. Thanks for the consult.  Marvel PlanJindong Cavion Faiola, MD PhD Stroke Neurology 01/01/2015 3:34 PM      To contact Stroke Continuity provider, please refer to [email or web address]. After hours, contact General Neurology

## 2015-01-02 ENCOUNTER — Inpatient Hospital Stay (HOSPITAL_COMMUNITY): Payer: Medicare Other

## 2015-01-02 DIAGNOSIS — I6789 Other cerebrovascular disease: Secondary | ICD-10-CM

## 2015-01-02 LAB — COMPREHENSIVE METABOLIC PANEL
ALBUMIN: 3 g/dL — AB (ref 3.5–5.0)
ALK PHOS: 134 U/L — AB (ref 38–126)
ALT: 35 U/L (ref 17–63)
AST: 40 U/L (ref 15–41)
Anion gap: 9 (ref 5–15)
BILIRUBIN TOTAL: 0.5 mg/dL (ref 0.3–1.2)
BUN: 33 mg/dL — AB (ref 6–20)
CALCIUM: 9.3 mg/dL (ref 8.9–10.3)
CO2: 24 mmol/L (ref 22–32)
CREATININE: 1.75 mg/dL — AB (ref 0.61–1.24)
Chloride: 104 mmol/L (ref 101–111)
GFR calc Af Amer: 42 mL/min — ABNORMAL LOW (ref >60)
GFR calc non Af Amer: 36 mL/min — ABNORMAL LOW (ref >60)
GLUCOSE: 95 mg/dL (ref 65–99)
Potassium: 4 mmol/L (ref 3.5–5.1)
SODIUM: 137 mmol/L (ref 135–145)
Total Protein: 6.5 g/dL (ref 6.5–8.1)

## 2015-01-02 LAB — CBC
HEMATOCRIT: 29.8 % — AB (ref 39.0–52.0)
Hemoglobin: 9.8 g/dL — ABNORMAL LOW (ref 13.0–17.0)
MCH: 28.7 pg (ref 26.0–34.0)
MCHC: 32.9 g/dL (ref 30.0–36.0)
MCV: 87.4 fL (ref 78.0–100.0)
Platelets: 176 10*3/uL (ref 150–400)
RBC: 3.41 MIL/uL — ABNORMAL LOW (ref 4.22–5.81)
RDW: 14.4 % (ref 11.5–15.5)
WBC: 8.3 10*3/uL (ref 4.0–10.5)

## 2015-01-02 MED ORDER — PRAVASTATIN SODIUM 40 MG PO TABS
40.0000 mg | ORAL_TABLET | Freq: Every day | ORAL | Status: DC
  Administered 2015-01-02: 40 mg via ORAL
  Filled 2015-01-02: qty 1

## 2015-01-02 NOTE — Progress Notes (Signed)
Patient recd from stepdown on bed, accompanied by wife and daughter. Patient and family oriented to unit and safety plan. Call bell in reach, bed in low position, and bed alarm on. Tele applied and called to central monitoring. Patient denies pain.

## 2015-01-02 NOTE — Progress Notes (Signed)
[place] TEAM 1 - Stepdown/ICU TEAM PROGRESS NOTE  Alexander OdeaGerald Conley XBJ:[id number] DOB: 01/30/1941 DOA: 12/30/2014 PCP: Lise AuerKHAN,JABER A, MD  Admit HPI / Brief Narrative: 74 y.o. male with a history of orthotopic heart transplant in 2011, stage III chronic kidney disease, prostate CA, and a recent history of orthostatic hypotension who presented to the ED for evaluation of altered mental status. [facility] and [facility] for evaluation of severe orthostatic hypotension. He was in his usual state of health until his wife noted that he was very difficult to arouse. EMS was called, patient was taken to [facility], where a CT of the head was negative, but per documentation from [facility] he had a tonic-clonic seizure. There was some question of right-sided deficits, therefore because of concern for acute CVA patient was then [facility]. CTA head and neck showed a possible right distal MCA occlusion but this would not explain his symptoms. EEG was negative.   Per family, he was told he cannot have a MRI due to a metallic mesh aortic patch.    HPI/Subjective: The patient is dramatically improved today.  His speech is clear and fluent.  He does appear to be very mildly confused but is interactive and denies any complaints.  There is no family present at time of my exam.  He specifically denies chest pain shortness of breath nausea vomiting abdominal pain or focal weakness.  Assessment/Plan:  Encephalopathy acute suspect seizures are the best explanation for his confusion - empirically started on Keppra - improvement is now picking up speed - begin PT/OT  Possible R MCA infarct  CTA suggests possible diminished flow in R MCA distribution, but this does not match his presenting sx of dysarthria - f/u CT notes no clear evidence of actual infarct   ?HCAP - doubt  empirically treated with vancomycin and Primaxin initially - suspect  LLL findings were due to  atx - with no fever and normal WBC stopped abx - no clinical evidence to suggest active infection at present  CKD stage III Creatinine appears to be slowly trending upward - follow trend - baseline appears to vary between 1.5 and 1.8 per review [facility]  HLD LDL 120 - now on lipitor   Orthostatic hypotension - autonomic dysreflexia  long-standing issues with orthostatic hypotension over past few months - previously on Florinef but this was discontinued because of severe hypokalemia - has [facility] very [facility] per his wife - blood pressure currently stable  Orthotopic heart transplant in 2011 Continue Prograf and prophylactic Bactrim - [facility] - no evidence of acute complications  Code Status: FULL Family Communication: no family present at time of visit today Disposition Plan: Transfer to neuro bed - begin PT/OT - possible discharge home 24-48 hours   Consultants: Neuro   Procedures: EEG 10/13 - generalized irregular slowing indicating a mild to moderate cerebral disturbance (encephalopathy). No focal slowing or epileptiform activity noted. EEG 10/14 - moderate diffuse slowing of the waking background - no epileptiform discharges or electrographic seizures seen TTE 10/16 -   Antibiotics: Primaxin 10/13 > 10/15 Vancomycin 10/13 > 10/15  DVT prophylaxis: lovenox   Objective: Blood pressure 156/98, pulse 83, temperature 97.6 F (36.4 C), temperature source Oral, resp. rate 15, height 6' (1.829 m), weight 81.4 kg (179 lb 7.3 oz), SpO2 100 %.  Intake/Output Summary (Last 24 hours) at 01/02/15 1124 Last data  filed at 01/02/15 0600  Gross per 24 hour  Intake 1112.5 ml  Output   1425 ml  Net -312.5 ml   Exam: General: No acute respiratory distress - much more alert and conversant Lungs: Clear to auscultation bilaterally without wheezes/crackles Cardiovascular: Regular rate and rhythm  without murmur Abdomen: Nontender, nondistended, soft, bowel sounds positive, no rebound, no ascites, no appreciable mass Extremities: No significant cyanosis, clubbing, edema bilateral lower extremities  Data Reviewed: Basic Metabolic Panel:  Recent Labs Lab 12/30/14 1600 12/30/14 1611 12/30/14 2230 12/31/14 0241 01/01/15 0319 01/02/15 0235  NA 131* 132*  --  133* 134* 137  K 4.8 4.7  --  4.6 4.4 4.0  CL 102 102  --  100* 103 104  CO2 22  --   --  21* 25 24  GLUCOSE 176* 176*  --  147* 110* 95  BUN 35* 36*  --  29* 32* 33*  CREATININE 1.58* 1.40* 1.47* 1.40* 1.65* 1.75*  CALCIUM 9.0  --   --  9.6 9.4 9.3    CBC:  Recent Labs Lab 12/30/14 1600 12/30/14 1611 12/30/14 2230 12/31/14 0241 01/01/15 0319 01/02/15 0235  WBC 10.2  --  9.5 10.2 9.6 8.3  NEUTROABS 9.0*  --   --   --   --   --   HGB 11.0* 11.6* 11.2* 11.4* 9.5* 9.8*  HCT 32.1* 34.0* 33.2* 33.8* 28.4* 29.8*  MCV 85.8  --  86.0 86.2 87.4 87.4  PLT 141*  --  154 167 158 176    Liver Function Tests:  Recent Labs Lab 12/30/14 1600 01/01/15 0319 01/02/15 0235  AST 38 30 40  ALT 48 35 35  ALKPHOS 138* 121 134*  BILITOT 0.4 0.7 0.5  PROT 7.2 6.9 6.5  ALBUMIN 3.3* 3.2* 3.0*    Coags:  Recent Labs Lab 12/30/14 1600  INR 1.25    Recent Labs Lab 12/30/14 1600  APTT 29    Recent Results (from the past 240 hour(s))  Blood culture (routine x 2)     Status: None (Preliminary result)   Collection Time: 12/30/14  5:08 PM  Result Value Ref Range Status   Specimen Description BLOOD LEFT HAND  Final   Special Requests BOTTLES DRAWN AEROBIC AND ANAEROBIC 4CC  Final   Culture NO GROWTH 2 DAYS  Final   Report Status PENDING  Incomplete  Blood culture (routine x 2)     Status: None (Preliminary result)   Collection Time: 12/30/14  5:16 PM  Result Value Ref Range Status   Specimen Description BLOOD RIGHT HAND  Final   Special Requests BOTTLES DRAWN AEROBIC AND ANAEROBIC 5CC  Final   Culture NO GROWTH 2  DAYS  Final   Report Status PENDING  Incomplete  MRSA PCR Screening     Status: None   Collection Time: 12/30/14 10:07 PM  Result Value Ref Range Status   MRSA by PCR NEGATIVE NEGATIVE Final    Comment:        The GeneXpert MRSA Assay (FDA approved for NASAL specimens only), is one component of a comprehensive MRSA colonization surveillance program. It is not intended to diagnose MRSA infection nor to guide or monitor treatment for MRSA infections.      Studies:   Recent x-ray studies have been reviewed in detail by the Attending Physician  Scheduled Meds:  Scheduled Meds: . aspirin  325 mg Oral Daily  . atorvastatin  80 mg Oral q1800  . enoxaparin (LOVENOX) injection  40 mg Subcutaneous Q24H  . famotidine  20 mg Oral Q1200  . finasteride  5 mg Oral Q1200  . levETIRAcetam  500 mg Oral BID  . midodrine  10 mg Oral TID WC  . multivitamin  1 tablet Oral Q1200  . sertraline  50 mg Oral Q1200  . sodium chloride  3 mL Intravenous Q12H  . sodium chloride  1 g Oral QID  . sulfamethoxazole-trimethoprim  1 tablet Oral Q M,W,F  . tacrolimus  3 mg Oral Daily  . tacrolimus  4 mg Oral QHS    Time spent on care of this patient: 35 mins   Alexander Conley T , MD   [facility] Office  [phone] Pager - Text Page per Loretha Stapler as per below:  On-Call/Text Page:      [email or web address]      password [email or web address]  If 7PM-7AM, please contact night-coverage [email or web address] Password [email or web address] 01/02/2015, 11:24 AM   LOS: 3 days

## 2015-01-02 NOTE — Progress Notes (Signed)
  Echocardiogram 2D Echocardiogram has been performed.  Alexander Conley, Alexander Conley 01/02/2015, 9:15 AM

## 2015-01-03 DIAGNOSIS — R569 Unspecified convulsions: Secondary | ICD-10-CM

## 2015-01-03 DIAGNOSIS — I951 Orthostatic hypotension: Secondary | ICD-10-CM

## 2015-01-03 DIAGNOSIS — N183 Chronic kidney disease, stage 3 (moderate): Secondary | ICD-10-CM

## 2015-01-03 DIAGNOSIS — Z941 Heart transplant status: Secondary | ICD-10-CM

## 2015-01-03 LAB — BASIC METABOLIC PANEL
ANION GAP: 9 (ref 5–15)
BUN: 31 mg/dL — ABNORMAL HIGH (ref 6–20)
CALCIUM: 9.2 mg/dL (ref 8.9–10.3)
CO2: 23 mmol/L (ref 22–32)
Chloride: 104 mmol/L (ref 101–111)
Creatinine, Ser: 1.65 mg/dL — ABNORMAL HIGH (ref 0.61–1.24)
GFR, EST AFRICAN AMERICAN: 45 mL/min — AB (ref >60)
GFR, EST NON AFRICAN AMERICAN: 39 mL/min — AB (ref >60)
Glucose, Bld: 112 mg/dL — ABNORMAL HIGH (ref 65–99)
Potassium: 4.1 mmol/L (ref 3.5–5.1)
Sodium: 136 mmol/L (ref 135–145)

## 2015-01-03 MED ORDER — ASPIRIN 325 MG PO TABS: 325.0000 mg | ORAL_TABLET | Freq: Every day | ORAL | Status: AC

## 2015-01-03 MED ORDER — LEVETIRACETAM 100 MG/ML PO SOLN: 500.0000 mg | Freq: Two times a day (BID) | ORAL | Status: AC

## 2015-01-03 NOTE — Progress Notes (Addendum)
Occupational Therapy Evaluation Patient Details Name: Alexander Conley MRN: [id number] DOB: 05/21/1940 Today's Date: 01/03/2015    History of Present Illness 74 y.o. male with history of baseline dementia presenting with AMS of unknown etiology.  At 0915 he was noted by his wife to be minimally unresponsive, not talking with question of right sided weakness. While in [facility] he was noted to have a tonic clonic seizure broke with 2mg  of ativan. Neuro workup underway   Clinical Impression   Pt admitted with the above diagnoses and presents with below problem list. Pt will benefit from continued acute OT to address the below listed deficits and maximize independence with BADLs prior to d/ cto venue below. PTA pt's spouse and daughter report pt was mostly w/c level. He did ambulate with +2 assist from his bed to his w/c across his bedroom. Sponge bath at baseline due to difficulty keeping pt in upright sitting position in shower chair. Pt able to feed self independently. Pt currently at mod +2 assist for LB ADLs. Pt's family present for part of session and report he is weaker during transfers compared to PTA. OT to continue to follow acutely.       Follow Up Recommendations  SNF;Supervision/Assistance - 24 hour    Equipment Recommendations  None recommended by OT    Recommendations for Other Services       Precautions / Restrictions Precautions Precautions: Fall Restrictions Weight Bearing Restrictions: No      Mobility Bed Mobility Overal bed mobility: Needs Assistance Bed Mobility: Supine to Sit     Supine to sit: Min assist     General bed mobility comments: assist at left hip to scoot to EOB  Transfers Overall transfer level: Needs assistance Equipment used: Rolling walker (2 wheeled) Transfers: Sit to/from [employer];Stand Pivot Transfers Sit to Stand: Min assist;+2 physical assistance Stand pivot transfers: Min assist;+2 physical assistance       General transfer  comment: from EOB to recliner; cues for weightshifting to facilitate more upright posture. Cues for sequencing BLE.     Balance Overall balance assessment: Needs assistance Sitting-balance support: Bilateral upper extremity supported;Feet supported Sitting balance-Leahy Scale: Poor Sitting balance - Comments: Pt heavilty using BUE to sit EOB about 5 minutes. Assist for sitting balance to don UB clothing.  Postural control: Posterior lean;Right lateral lean Standing balance support: Bilateral upper extremity supported;During functional activity Standing balance-Leahy Scale: Poor Standing balance comment: rw and mostly min A to stand for almost 2 minutes, mod A at times with pt feeling "woozy" in standing able to correct with cues but difficulty maintaining upright, midline posture                            ADL Overall ADL's : Needs assistance/impaired Eating/Feeding: Set up;Bed level;Sitting Eating/Feeding Details (indicate cue type and reason): supported sitting Grooming: Set up;Min guard;Sitting Grooming Details (indicate cue type and reason): supported sitting Upper Body Bathing: Moderate assistance;Sitting   Lower Body Bathing: Sit to/from stand;Moderate assistance;+2 for physical assistance   Upper Body Dressing : Sitting;Moderate assistance   Lower Body Dressing: Moderate assistance;Sit to/from stand;+2 for physical assistance   Toilet Transfer: Minimal assistance;+2 for physical assistance;Stand-pivot;BSC;RW   Toileting- Clothing Manipulation and Hygiene: Sit to/from stand;+2 for physical assistance;Moderate assistance     Tub/Shower Transfer Details (indicate cue type and reason): sponge bath at baseline Functional mobility during ADLs: Minimal assistance;+2 for physical assistance;Rolling walker (pivotal steps) General ADL Comments: Pt's family  present for last half of session and able to observe SPT. They report he is weaker now than PTA. Pt unable to access  left foot while partially reclined in bed. Able to access right foot but needed assist to don socks. Pt reporting wooziness during transfer but motivated to complete.      Vision     Perception     Praxis      Pertinent Vitals/Pain Pain Assessment: No/denies pain     Hand Dominance Right   Extremity/Trunk Assessment Upper Extremity Assessment Upper Extremity Assessment: Generalized weakness   Lower Extremity Assessment Lower Extremity Assessment: Defer to PT evaluation       Communication Communication Communication: No difficulties   Cognition   Behavior During Therapy: WFL for tasks assessed/performed Overall Cognitive Status: History of cognitive impairments - at baseline       Memory: Decreased short-term memory;Decreased recall of precautions             General Comments      Pt denied dizziness sitting EOB. During transfers pt reporting feeling "woozy." BP assessed in sitting after SPT 144/100. Pt then stood a second time for almost 2 minutes with bp assessed at 156/104. Pt's family incredulous at bp reading in standing "that can't be right, it usually goes down"         Exercises       Shoulder Instructions      Home Living Family/patient expects to be discharged to:: Unsure Living Arrangements: Spouse/significant other                               Additional Comments: Pt lives with spouse and adult daughter who having been the sole providers of care.       Prior Functioning/Environment Level of Independence: Needs assistance        Comments: Pt's spouse and daughter report pt was mostly w/c level. He did ambulate with +2 assist from his bed to his w/c across his bedroom. Sponge bath at baseline due to difficulty keeping pt in upright sitting position in shower chair. Pt able to feed self independently.     OT Diagnosis: Generalized weakness   OT Problem List: Decreased strength;Decreased activity tolerance;Impaired balance  (sitting and/or standing);Decreased safety awareness;Decreased knowledge of use of DME or AE;Decreased knowledge of precautions;Cardiopulmonary status limiting activity   OT Treatment/Interventions: Self-care/ADL training;Therapeutic exercise;Neuromuscular education;Energy conservation;DME and/or AE instruction;Therapeutic activities;Patient/family education;Balance training    OT Goals(Current goals can be found in the care plan section) Acute Rehab OT Goals OT Goal Formulation: With patient/family Potential to Achieve Goals: Good ADL Goals Pt Will Perform Grooming: with supervision;sitting Pt Will Perform Upper Body Bathing: with min assist;sitting Pt Will Perform Lower Body Bathing: sit to/from stand;with max assist Pt Will Perform Upper Body Dressing: with min assist;sitting Pt Will Perform Lower Body Dressing: with max assist;sit to/from stand Pt Will Transfer to Toilet: with max assist;stand pivot transfer;bedside commode Pt Will Perform Toileting - Clothing Manipulation and hygiene: with max assist;sit to/from stand Pt/caregiver will Perform Home Exercise Program: Increased strength;Both right and left upper extremity;With theraband;With written HEP provided  OT Frequency: Min 2X/week   Barriers to D/C:            Co-evaluation PT/OT/SLP Co-Evaluation/Treatment: Yes Reason for Co-Treatment: For patient/therapist safety   OT goals addressed during session: ADL's and self-care      End of Session Equipment Utilized During Treatment: Gait belt;Rolling walker  Activity Tolerance: Patient tolerated treatment well Patient left: in chair;with call bell/phone within reach;with chair alarm set;with family/visitor present   Time: 7829-5621 OT Time Calculation (min): 36 min Charges:  OT General Charges $OT Visit: 1 Procedure OT Evaluation $Initial OT Evaluation Tier I: 1 Procedure G-Codes:    Pilar Grammes 01/14/2015, 10:10 AM

## 2015-01-03 NOTE — Progress Notes (Signed)
Physical Therapy Treatment Patient Details Name: Antony OdeaGerald Blasing MRN: [id number] DOB: 07/03/1940 Today's Date: 01/03/2015    History of Present Illness 74 y.o. male with history of baseline dementia presenting with AMS of unknown etiology.  At 0915 he was noted by his wife to be minimally unresponsive, not talking with question of right sided weakness. While in [facility] he was noted to have a tonic clonic seizure broke with 2mg  of ativan. Neuro workup underway    PT Comments    Pt was seen today with OT secondary to patient and therapist safety and to maximize mobility. Pt is progressing with therapy. Pt's wife and daughter was present during the treatment and reported that he is more dependent with mobility than before. Family reports his BP drops with standing. BP actually increased today with standing 156/104. Pt did c/o feeling woozy during standing. Pt would benefit from continued acute skilled PT and recommend d/c to SNF facility secondary to level of care needed and family wishes.  Follow Up Recommendations  SNF;Supervision/Assistance - 24 hour     Equipment Recommendations  None recommended by PT    Recommendations for Other Services       Precautions / Restrictions Precautions Precautions: Fall Restrictions Weight Bearing Restrictions: No    Mobility  Bed Mobility Overal bed mobility: Needs Assistance Bed Mobility: Supine to Sit     Supine to sit: Min assist     General bed mobility comments: assist at left hip to scoot to EOB  Transfers Overall transfer level: Needs assistance Equipment used: Rolling walker (2 wheeled) Transfers: Sit to/from [employer];Stand Pivot Transfers Sit to Stand: +2 physical assistance;Min assist Stand pivot transfers: +2 physical assistance;Min assist       General transfer comment: from EOB to recliner; cues for weightshifting to facilitate more upright posture. Cues for sequencing BLE. Pt able to Pivot feet with cues and able to take a  small step forward and back during functional standing. Pt with a R trunk lean during standing.  Ambulation/Gait Ambulation/Gait assistance:  (pt nonambulatory PTA)               Stairs            Wheelchair Mobility    Modified Rankin (Stroke Patients Only)       Balance Overall balance assessment: Needs assistance Sitting-balance support: Bilateral upper extremity supported;Feet supported Sitting balance-Leahy Scale: Poor Sitting balance - Comments: pt leaned to left side with fatigue, pt needed assistance for upright posture and midline. Postural control: Posterior lean;Right lateral lean Standing balance support: Bilateral upper extremity supported;During functional activity Standing balance-Leahy Scale: Poor Standing balance comment: Pt static standing varied from min assist to mod assist. Pt with right lateral lean and forward flexed trunk. Pt able to correct with verbal and tactile cues. BP taken in standing.                    Cognition Arousal/Alertness: Awake/alert Behavior During Therapy: WFL for tasks assessed/performed Overall Cognitive Status: History of cognitive impairments - at baseline       Memory: Decreased recall of precautions;Decreased short-term memory              Exercises General Exercises - Lower Extremity Ankle Circles/Pumps: AROM;Strengthening;Both;10 reps;Supine Quad Sets: AROM;Strengthening;Left;10 reps;Supine Heel Slides: AAROM;Strengthening;Left;10 reps;Supine Straight Leg Raises: AAROM;Strengthening;Left;Supine    General Comments General comments (skin integrity, edema, etc.): Pt denied dizziness sitting EOB. During transfers pt reporting feeling "woozy." BP assessed in sitting after SPT 144/100.  Pt then stood a second time for almost 2 minutes with bp assessed at 156/104. Pt's family incredulous at bp reading in standing "that can't be right, it usually goes down"      Pertinent Vitals/Pain Pain Assessment:  Faces Faces Pain Scale: No hurt    Home Living Family/patient expects to be discharged to:: Skilled nursing facility Living Arrangements: Spouse/significant other             Additional Comments: Pt lives with spouse and adult daughter who having been the sole providers of care.     Prior Function Level of Independence: Needs assistance      Comments: Pt's spouse and daughter report pt was mostly w/c level. He did ambulate with +2 assist from his bed to his w/c across his bedroom. Sponge bath at baseline due to difficulty keeping pt in upright sitting position in shower chair. Pt able to feed self independently.    PT Goals (current goals can now be found in the care plan section) Progress towards PT goals: Progressing toward goals    Frequency  Min 3X/week    PT Plan Current plan remains appropriate    Co-evaluation PT/OT/SLP Co-Evaluation/Treatment: Yes Reason for Co-Treatment: For patient/therapist safety PT goals addressed during session: Mobility/safety with mobility;Balance;Strengthening/ROM;Proper use of DME OT goals addressed during session: ADL's and self-care     End of Session Equipment Utilized During Treatment: Gait belt Activity Tolerance: Patient limited by fatigue Patient left: in chair;with call bell/phone within reach;with chair alarm set;with family/visitor present     Time: 1610-9604 PT Time Calculation (min) (ACUTE ONLY): 22 min  Charges:  $Therapeutic Activity: 8-22 mins                    G Codes:      Greggory Stallion 01/03/2015, 11:02 AM

## 2015-01-03 NOTE — Care Management Note (Signed)
Case Management Note  Patient Details  Name: Alexander Conley MRN: [id number] Date of Birth: 11/19/1940  Subjective/Objective:                    Action/Plan: Patient was admitted with acute encephalopathy. Lives at home with wife. Current PT recommendation is for SNF.  CM will continue to follow for discharge needs.  Expected Discharge Date:   (Pending)               Expected Discharge Plan:  Skilled Nursing Facility  In-House Referral:  Clinical Social Work  Discharge planning Services     Post Acute Care Choice:    Choice offered to:     DME Arranged:    DME Agency:     HH Arranged:    HH Agency:     Status of Service:  In process, will continue to follow  Medicare Important Message Given:    Date Medicare IM Given:    Medicare IM give by:    Date Additional Medicare IM Given:    Additional Medicare Important Message give by:     If discussed at Long Length of Stay Meetings, dates discussed:    Additional Comments:  [name], [name], RN 01/03/2015, 11:08 AM

## 2015-01-03 NOTE — Clinical Social Work Note (Signed)
Clinical Social Work Assessment  Patient Details  Name: Alexander Conley MRN: [id number] Date of Birth: Jul 20, 1940  Date of referral:  01/03/15               Reason for consult:  Facility Placement                Permission sought to share information with:  Family Supports Permission granted to share information::  Yes, Verbal Permission Granted  Name::     Richman,Patricia  Relationship::  Wife   Housing/Transportation Living arrangements for the past 2 months:  Single Family Home Source of Information:  Adult Children, Spouse Patient Interpreter Needed:  None Criminal Activity/Legal Involvement Pertinent to Current Situation/Hospitalization:  No - Comment as needed Significant Relationships:  Adult Children, Spouse Lives with:  Spouse, Adult Children Do you feel safe going back to the place where you live?  No Need for family participation in patient care:  Yes (Comment)  Care giving concerns: Maudie Mercury and Mardene Celeste has finanical questions. CSW provided the family with contact informed for [place] for a  Medicaid application.    Social Worker assessment / plan:  CSW met with the pt's wife Mardene Celeste and daughter Maudie Mercury. CSW introduced self and purpose of the visit. CSW discussed SNF rehab. Kim provided the CSW with three SNFs choices. Kim had a lot of medical questions for the MD. CSW called the MD. CSW answered all social questions in which Maudie Mercury inquired about. CSW will continue to follow this pt and assist with discharge as needed.   Employment status:  Retired Forensic scientist:  Medicare PT Recommendations:  [place] / Referral to community resources:  [place]  Patient/Family's Response to care:  Maudie Mercury and Mardene Celeste shared that care that the pt received has been well.   Patient/Family's Understanding of and Emotional Response to Diagnosis, Current Treatment, and Prognosis: Maudie Mercury and Mardene Celeste provided the CSW with great details regarding the pt's  health. Maudie Mercury and Mardene Celeste reported that the pt has been admitted to the hospital 5 times in the last  months. Maudie Mercury and Mardene Celeste expressed frustration   Emotional Assessment Appearance:  Appears stated age Attitude/Demeanor/Rapport:   (Calm ) Affect (typically observed):  Quiet Orientation:  Oriented to Self Alcohol / Substance use:  Not Applicable Psych involvement (Current and /or in the community):  No (Comment)  Discharge Needs  Concerns to be addressed:  Denies Needs/Concerns at this time Readmission within the last 30 days:  No Current discharge risk:  None Barriers to Discharge:  No Barriers Identified   Saturnino Liew, LCSW 01/03/2015, 10:43 AM

## 2015-01-03 NOTE — Progress Notes (Signed)
Patient is discharged from room 5C21 at this time. Alert and in stable condition. IV site d/c'd as well as tele. Report called in to receiving nurse Elberta FortisZachary Davis, LPN at [facility]. Left unit via stretcher by PTAR with all belongings at side.

## 2015-01-03 NOTE — Care Management Important Message (Signed)
Important Message  Patient Details  Name: Alexander Conley MRN: [id number] Date of Birth: 08/04/1940   Medicare Important Message Given:  Yes-second notification given    Orson AloeMegan P Yandiel Bergum 01/03/2015, 1:28 PM

## 2015-01-03 NOTE — Clinical Social Work Placement (Signed)
   CLINICAL SOCIAL WORK PLACEMENT  NOTE  Date:  01/03/2015  Patient Details  Name: Alexander Conley MRN: [id number] Date of Birth: 05/05/1940  Clinical Social Work is seeking post-discharge placement for this patient at the Skilled  Nursing Facility level of care (*CSW will initial, date and re-position this form in  chart as items are completed):  Yes   Patient/family provided with [place] [facility] Clinical Social Work Department's list of facilities offering this level of care within the geographic area requested by the patient (or if unable, by the patient's family).  Yes   Patient/family informed of their freedom to choose among providers that offer the needed level of care, that participate in Medicare, Medicaid or managed care program needed by the patient, have an available bed and are willing to accept the patient.  Yes   Patient/family informed of [facility]'s ownership interest in [facility] and [facility], as well as of the fact that they are under no obligation to receive care at these facilities.  PASRR submitted to EDS on       PASRR number received on       Existing PASRR number confirmed on 01/03/15     FL2 transmitted to all facilities in geographic area requested by pt/family on 01/03/15     FL2 transmitted to all facilities within larger geographic area on       Patient informed that his/her managed care company has contracts with or will negotiate with certain facilities, including the following:        Yes   Patient/family informed of bed offers received.  [facility]     Physician recommends and patient chooses bed at      [facility] on 01/03/15.  Patient to be transferred to facility by PTAR      Patient family notified on 01/03/15 of transfer.  Name of family member notified:  [name], wife and [name], daughter      PHYSICIAN       Additional Comment:     _______________________________________________ Gwynne EdingerBibbs, Mekhi Lascola, LCSW 01/03/2015, 11:21 AM

## 2015-01-03 NOTE — Discharge Summary (Addendum)
Physician Discharge Summary  Alexander OdeaGerald Conley ZOX:[id number] DOB: 06/28/1940 DOA: 12/30/2014  PCP: Lise AuerKHAN,JABER A, MD  Admit date: 12/30/2014 Discharge date: 01/03/2015  Time spent: 60 minutes  Recommendations for Outpatient Follow-up:  1. outpt surveillance of Carotid artery stenosis 2. F/u with Neuro at [facility] - has appt for next month  Discharge Condition: stable  Discharge Diagnoses:  Principal Problem:   Encephalopathy acute Active Problems: Seizure    CKD (chronic kidney disease), stage III   Orthostatic hypotension   Heart transplanted  Carotid artery stenosis   History of present illness:  This is a 74 year old male who is wheelchair-bound, status post orthotopic heart transplant in 2011, chronic kidney disease stage III, prostate cancer, orthostatic hypotension who presented to the hospital for altered mental status. He was recently hospitalized at [facility] in [place] [facility] to evaluate orthostatic hypotension. On the day of admission his wife mention that he was very difficult to arise. EMS transfer the patient to [facility] and initial head CT was found to be negative. Documentation from [facility] revealed that the patient had a tonic-clonic seizure and there were some concerns that he had right-sided deficits and a possible CVA. He was therefore transferred to [facility] . Upon arrival to [facility] he was noted to be obtunded (had received 2 mg of Ativan for seizure).  Hospital Course:  Acute encephalopathy  - This is suspected to be a prolonged post ictal state secondary to a seizure episode superimposed on dementia - EEG performed-reveals diffuse slowing - Ammonia level normal -neurology has evaluated him and has initiated Keppra   Possible right MCA infarct  - underwent a CTA which suggested diminished flow in the right MCA distribution - per neurology, this would not explain the patient's symptoms -Stroke workup completed -LDL 120- cont Pravachol  (family does not want any changes) -Hemoglobin A1c 5.5 -see imaging reports below - ASA 325 mg daily started per neuro recommendations  Bilateral carotid stenosis -CTA neck reveals 68% stenosis in proximal RCA and 64% stenosis in proximal left RCA -Outpatient surveillance-no further workup or intervention needed at this time  Orthotopic heart transplant in 2011 -Continue Prograf and Bactrim  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 HCAP -Initially started on vancomycin and Primaxin -the patient was asymptomatic, no fever, no leukocytosis and therefore was determined that the patient did not have a pneumonia and antibiotics were discontinued-he remains stable  Chronic kidney disease stage III -Baseline creatinine between 1.5-1.8   Procedures:  2-D echo Left ventricle: The cavity size was normal. Wall thickness was increased in a pattern of mild LVH. Systolic function was normal. The estimated ejection fraction was in the range of 60% to 65%.  EEG  Consultations:  Neurology  Discharge Exam: Filed Weights   12/30/14 1550 12/30/14 2200 01/01/15 0506  Weight: 85.276 kg (188 lb) 80.8 kg (178 lb 2.1 oz) 81.4 kg (179 lb 7.3 oz)   Filed Vitals:   01/03/15 0610  BP: 139/89  Pulse: 81  Temp: 97.8 F (36.6 C)  Resp: 18    General: Alert, oriented only to person, no distress Cardiovascular: RRR, no murmurs  Respiratory: clear to auscultation bilaterally GI: soft, non-tender, non-distended, bowel sound positive  Discharge Instructions You were cared for by a hospitalist during your hospital stay.  If you have any questions about your discharge medications or the care you received while you were in the hospital after you  are discharged, you can call the unit and asked to speak with the hospitalist on call if the hospitalist that took care of you is not available. Once you are discharged, your primary care physician will handle any further medical issues. Please note that NO REFILLS for any discharge medications will be authorized once you are discharged, as it is imperative that you return to your primary care physician (or establish a relationship with a primary care physician if you do not have one) for your aftercare needs so that they can reassess your need for medications and monitor your lab values.      Discharge Instructions    Discharge instructions    Complete by:  As directed   Low cholesterol diet     Increase activity slowly    Complete by:  As directed             Medication List    TAKE these medications        amoxicillin 500 MG capsule  Commonly known as:  AMOXIL  Take 2,000 mg by mouth as needed (1 hour prior to dental appt).     aspirin 325 MG tablet  Take 1 tablet (325 mg total) by mouth daily.     docusate sodium 100 MG capsule  Commonly known as:  COLACE  Take 100 mg by mouth at bedtime.     famotidine 20 MG tablet  Commonly known as:  PEPCID  Take 20 mg by mouth daily at 12 noon.     finasteride 5 MG tablet  Commonly known as:  PROSCAR  Take 5 mg by mouth daily at 12 noon.     levETIRAcetam 100 MG/ML solution  Commonly known as:  KEPPRA  Take 5 mLs (500 mg total) by mouth 2 (two) times daily.     midodrine 10 MG tablet  Commonly known as:  PROAMATINE  Take 10 mg by mouth 3 (three) times daily.     pravastatin 40 MG tablet  Commonly known as:  PRAVACHOL  Take 40 mg by mouth at bedtime.     PRESERVISION AREDS 2 Caps  Take 1 tablet by mouth daily at 12 noon.     sertraline 50 MG tablet  Commonly known as:  ZOLOFT  Take 50 mg by mouth daily at 12 noon.     sodium chloride 1 G tablet  Take 1 g by mouth 4 (four) times daily.     sulfamethoxazole-trimethoprim  400-80 MG tablet  Commonly known as:  BACTRIM,SEPTRA  Take 1 tablet by mouth every Monday, Wednesday, and Friday.     tacrolimus 1 MG capsule  Commonly known as:  PROGRAF  Take 3-4 mg by mouth 2 (two) times daily.       Allergies  Allergen Reactions  . Tape Hives    adhesive      The results of significant diagnostics from this hospitalization (including imaging, microbiology, ancillary and laboratory) are listed below for reference.    Significant Diagnostic Studies: Ct Angio Head W/cm &/or Wo Cm  12/30/2014  CLINICAL DATA:  73 year old male with left-sided weakness (per Dr. Hosie Poisson). Initial encounter. EXAM: CT ANGIOGRAPHY HEAD AND NECK TECHNIQUE: Multidetector CT imaging of the head and neck was performed using the standard protocol during bolus administration of intravenous contrast. Multiplanar CT image reconstructions and MIPs were obtained to evaluate the vascular anatomy.  Carotid stenosis measurements (when applicable) are obtained utilizing NASCET criteria, using the distal internal carotid diameter as the denominator. CONTRAST:  50mL OMNIPAQUE IOHEXOL 350 MG/ML SOLN COMPARISON:  None. FINDINGS: CT HEAD Brain: Possibility of right middle cerebral artery distribution is raised. No intracranial hemorrhage or new intracranial enhancing lesion. No hydrocephalus. Calvarium and skull base: Negative. Paranasal sinuses: Clear. Orbits: Negative.  Post lens replacement. CTA NECK Aortic arch: Ectatic 3 vessel aortic arch. Calcified plaque with slight narrowing origin left common carotid artery and left subclavian artery. Right carotid system: Plaque right carotid bifurcation. Ectatic right internal carotid artery with irregular plaque contributing to 68% diameter stenosis proximal right internal carotid artery. Plaque distal cervical segment right internal carotid artery with less than 50% diameter stenosis. Left carotid system: Plaque left carotid bifurcation. 64% diameter stenosis proximal left  internal carotid artery. Calcification distal left internal carotid artery cervical segment with less than 50% diameter stenosis. Vertebral arteries:Minimal narrowing origin of the right vertebral artery. Mild narrowing origin left vertebral artery. Right vertebral artery slightly dominant in size. Skeleton: Multilevel cervical spondylotic changes with various degrees of spinal stenosis and foraminal narrowing. Other neck: No worrisome primary neck mass or lung apical lesion. CTA HEAD Anterior circulation: Atherosclerotic type changes cavernous segment of the internal carotid artery bilaterally. Mild narrowing right internal carotid artery cavernous segment with associated ectasia. Slightly prominent infundibulum without aneurysm. Mild to slightly moderate narrowing portion of the left internal carotid artery proximal cavernous segment of poststenotic ectasia. Questionable decrease number of visualized right middle cerebral artery branch vessels when compared to left. Middle cerebral artery branch vessel narrowing and irregularity bilaterally. Posterior circulation: Ectatic vertebral arteries without high-grade stenosis. Ectatic basilar artery with possible small fenestration without high-grade stenosis. Venous sinuses: Patent. Anatomic variants: Negative. Delayed phase: Not obtained as per Dr. Minus Breeding request. Exam reviewed with Dr. Wyatt Portela at the time of imaging. IMPRESSION: CT HEAD Possibility of right middle cerebral artery distribution is raised. No intracranial hemorrhage or new intracranial enhancing lesion. CTA NECK Ectatic right internal carotid artery with irregular plaque contributing to 68% diameter stenosis proximal right internal carotid artery. Plaque distal cervical segment right internal carotid artery with less than 50% diameter stenosis. 64% diameter stenosis proximal left internal carotid artery. Calcification distal left internal carotid artery cervical segment with less than 50% diameter  stenosis. Minimal narrowing origin of the right vertebral artery. Mild narrowing origin left vertebral artery. Right vertebral artery slightly dominant in size. CTA HEAD Atherosclerotic type changes cavernous segment of the internal carotid artery bilaterally. Mild narrowing right internal carotid artery cavernous segment with associated ectasia. Mild to slightly moderate narrowing portion of the left internal carotid artery proximal cavernous segment of poststenotic ectasia. Questionable decrease number of visualized right middle cerebral artery branch vessels when compared to left. Middle cerebral artery branch vessel narrowing and irregularity bilaterally. Ectatic vertebral arteries without high-grade stenosis. Ectatic basilar artery with possible small fenestration without high-grade stenosis. Electronically Signed   By: Lacy Duverney M.D.   On: 12/30/2014 16:34   Ct Head Wo Contrast  12/31/2014  CLINICAL DATA:  74 year old male transferred from [facility] with stroke-like symptoms. Initial encounter. possible right MCA infarct. EXAM: CT HEAD WITHOUT CONTRAST TECHNIQUE: Contiguous axial images were obtained from the base of the skull through the vertex without intravenous contrast. COMPARISON:  CTA head and neck 1539 hr today, and [facility] head CT 1130 hr today. FINDINGS: Stable paranasal sinuses and mastoids. Stable visualized osseous structures. Stable orbit and scalp soft tissues. The  patient no longer has rightward gaze deviation. Calcified atherosclerosis at the skull base. No acute cortically based infarct identified. No intracranial mass effect. No acute intracranial hemorrhage identified. No ventriculomegaly. Gray-white matter differentiation appears stable and within normal limits throughout the brain. IMPRESSION: Stable since 1130 hr today and negative for age noncontrast CT appearance of the brain. Electronically Signed   By: Odessa Fleming M.D.   On: 12/31/2014 00:34   Ct Angio Neck  W/cm &/or Wo/cm  12/30/2014  CLINICAL DATA:  74 year old male with left-sided weakness (per Dr. Hosie Poisson). Initial encounter. EXAM: CT ANGIOGRAPHY HEAD AND NECK TECHNIQUE: Multidetector CT imaging of the head and neck was performed using the standard protocol during bolus administration of intravenous contrast. Multiplanar CT image reconstructions and MIPs were obtained to evaluate the vascular anatomy. Carotid stenosis measurements (when applicable) are obtained utilizing NASCET criteria, using the distal internal carotid diameter as the denominator. CONTRAST:  50mL OMNIPAQUE IOHEXOL 350 MG/ML SOLN COMPARISON:  None. FINDINGS: CT HEAD Brain: Possibility of right middle cerebral artery distribution is raised. No intracranial hemorrhage or new intracranial enhancing lesion. No hydrocephalus. Calvarium and skull base: Negative. Paranasal sinuses: Clear. Orbits: Negative.  Post lens replacement. CTA NECK Aortic arch: Ectatic 3 vessel aortic arch. Calcified plaque with slight narrowing origin left common carotid artery and left subclavian artery. Right carotid system: Plaque right carotid bifurcation. Ectatic right internal carotid artery with irregular plaque contributing to 68% diameter stenosis proximal right internal carotid artery. Plaque distal cervical segment right internal carotid artery with less than 50% diameter stenosis. Left carotid system: Plaque left carotid bifurcation. 64% diameter stenosis proximal left internal carotid artery. Calcification distal left internal carotid artery cervical segment with less than 50% diameter stenosis. Vertebral arteries:Minimal narrowing origin of the right vertebral artery. Mild narrowing origin left vertebral artery. Right vertebral artery slightly dominant in size. Skeleton: Multilevel cervical spondylotic changes with various degrees of spinal stenosis and foraminal narrowing. Other neck: No worrisome primary neck mass or lung apical lesion. CTA HEAD Anterior  circulation: Atherosclerotic type changes cavernous segment of the internal carotid artery bilaterally. Mild narrowing right internal carotid artery cavernous segment with associated ectasia. Slightly prominent infundibulum without aneurysm. Mild to slightly moderate narrowing portion of the left internal carotid artery proximal cavernous segment of poststenotic ectasia. Questionable decrease number of visualized right middle cerebral artery branch vessels when compared to left. Middle cerebral artery branch vessel narrowing and irregularity bilaterally. Posterior circulation: Ectatic vertebral arteries without high-grade stenosis. Ectatic basilar artery with possible small fenestration without high-grade stenosis. Venous sinuses: Patent. Anatomic variants: Negative. Delayed phase: Not obtained as per Dr. Minus Breeding request. Exam reviewed with Dr. Wyatt Portela at the time of imaging. IMPRESSION: CT HEAD Possibility of right middle cerebral artery distribution is raised. No intracranial hemorrhage or new intracranial enhancing lesion. CTA NECK Ectatic right internal carotid artery with irregular plaque contributing to 68% diameter stenosis proximal right internal carotid artery. Plaque distal cervical segment right internal carotid artery with less than 50% diameter stenosis. 64% diameter stenosis proximal left internal carotid artery. Calcification distal left internal carotid artery cervical segment with less than 50% diameter stenosis. Minimal narrowing origin of the right vertebral artery. Mild narrowing origin left vertebral artery. Right vertebral artery slightly dominant in size. CTA HEAD Atherosclerotic type changes cavernous segment of the internal carotid artery bilaterally. Mild narrowing right internal carotid artery cavernous segment with associated ectasia. Mild to slightly moderate narrowing portion of the left internal carotid artery proximal cavernous segment of poststenotic ectasia. Questionable decrease  number of visualized right middle cerebral artery branch vessels when compared to left. Middle cerebral artery branch vessel narrowing and irregularity bilaterally. Ectatic vertebral arteries without high-grade stenosis. Ectatic basilar artery with possible small fenestration without high-grade stenosis. Electronically Signed   By: Lacy Duverney M.D.   On: 12/30/2014 16:34   Dg Chest Port 1 View  12/30/2014  CLINICAL DATA:  Altered mental status and right-sided weakness EXAM: PORTABLE CHEST 1 VIEW COMPARISON:  12/30/2014 from [facility]. FINDINGS: Streaky retrocardiac opacity, favoring atelectasis given morphology. Borderline cardiomegaly. Negative aortic and hilar contours. Patient has undergone median sternotomy, reportedly for heart transplant. No edema, effusion, or air leak. Symmetric biapical pleural scarring. IMPRESSION: Mild retrocardiac opacification, likely atelectasis. Given history of altered mental status, underlying aspiration should be considered. Electronically Signed   By: Marnee Spring M.D.   On: 12/30/2014 16:12    Microbiology: Recent Results (from the past 240 hour(s))  Blood culture (routine x 2)     Status: None (Preliminary result)   Collection Time: 12/30/14  5:08 PM  Result Value Ref Range Status   Specimen Description BLOOD LEFT HAND  Final   Special Requests BOTTLES DRAWN AEROBIC AND ANAEROBIC 4CC  Final   Culture NO GROWTH 3 DAYS  Final   Report Status PENDING  Incomplete  Blood culture (routine x 2)     Status: None (Preliminary result)   Collection Time: 12/30/14  5:16 PM  Result Value Ref Range Status   Specimen Description BLOOD RIGHT HAND  Final   Special Requests BOTTLES DRAWN AEROBIC AND ANAEROBIC 5CC  Final   Culture NO GROWTH 3 DAYS  Final   Report Status PENDING  Incomplete  MRSA PCR Screening     Status: None   Collection Time: 12/30/14 10:07 PM  Result Value Ref Range Status   MRSA by PCR NEGATIVE NEGATIVE Final    Comment:        The  GeneXpert MRSA Assay (FDA approved for NASAL specimens only), is one component of a comprehensive MRSA colonization surveillance program. It is not intended to diagnose MRSA infection nor to guide or monitor treatment for MRSA infections.      Labs: Basic Metabolic Panel:  Recent Labs Lab 12/30/14 1600 12/30/14 1611 12/30/14 2230 12/31/14 0241 01/01/15 0319 01/02/15 0235 01/02/15 2350  NA 131* 132*  --  133* 134* 137 136  K 4.8 4.7  --  4.6 4.4 4.0 4.1  CL 102 102  --  100* 103 104 104  CO2 22  --   --  21* 25 24 23   GLUCOSE 176* 176*  --  147* 110* 95 112*  BUN 35* 36*  --  29* 32* 33* 31*  CREATININE 1.58* 1.40* 1.47* 1.40* 1.65* 1.75* 1.65*  CALCIUM 9.0  --   --  9.6 9.4 9.3 9.2   Liver Function Tests:  Recent Labs Lab 12/30/14 1600 01/01/15 0319 01/02/15 0235  AST 38 30 40  ALT 48 35 35  ALKPHOS 138* 121 134*  BILITOT 0.4 0.7 0.5  PROT 7.2 6.9 6.5  ALBUMIN 3.3* 3.2* 3.0*   No results for input(s): LIPASE, AMYLASE in the last 168 hours.  Recent Labs Lab 01/01/15 0319  AMMONIA 16   CBC:  Recent Labs Lab 12/30/14 1600 12/30/14 1611 12/30/14 2230 12/31/14 0241 01/01/15 0319 01/02/15 0235  WBC 10.2  --  9.5 10.2 9.6 8.3  NEUTROABS 9.0*  --   --   --   --   --  HGB 11.0* 11.6* 11.2* 11.4* 9.5* 9.8*  HCT 32.1* 34.0* 33.2* 33.8* 28.4* 29.8*  MCV 85.8  --  86.0 86.2 87.4 87.4  PLT 141*  --  154 167 158 176   Cardiac Enzymes: No results for input(s): CKTOTAL, CKMB, CKMBINDEX, TROPONINI in the last 168 hours. BNP: BNP (last 3 results) No results for input(s): BNP in the last 8760 hours.  ProBNP (last 3 results) No results for input(s): PROBNP in the last 8760 hours.  CBG: No results for input(s): GLUCAP in the last 168 hours.     SignedCalvert Cantor, MD [facility] 01/03/2015, 10:16 AM

## 2015-01-04 LAB — CULTURE, BLOOD (ROUTINE X 2)
CULTURE: NO GROWTH
Culture: NO GROWTH

## 2015-01-04 LAB — TACROLIMUS LEVEL: Tacrolimus (FK506) - LabCorp: 7.8 ng/mL (ref 2.0–20.0)

## 2015-05-18 DEATH — deceased
